# Patient Record
Sex: Female | Born: 1985 | Race: Black or African American | Hispanic: No | Marital: Single | Smoking: Never smoker
Health system: Southern US, Community
[De-identification: ages and names within clinical notes are randomized; demographics above are authoritative.]

## PROBLEM LIST (undated history)

## (undated) DIAGNOSIS — T7840XA Allergy, unspecified, initial encounter: Secondary | ICD-10-CM

## (undated) HISTORY — DX: Allergy, unspecified, initial encounter: T78.40XA

---

## 2011-05-08 ENCOUNTER — Emergency Department (HOSPITAL_BASED_OUTPATIENT_CLINIC_OR_DEPARTMENT_OTHER)
Admission: EM | Admit: 2011-05-08 | Discharge: 2011-05-08 | Disposition: A | Discharge status: Home / Self Care | Payer: BC Managed Care – PPO | Attending: Emergency Medicine | Admitting: Emergency Medicine

## 2011-05-08 DIAGNOSIS — L02419 Cutaneous abscess of limb, unspecified: Secondary | ICD-10-CM | POA: Insufficient documentation

## 2011-05-11 ENCOUNTER — Emergency Department (HOSPITAL_BASED_OUTPATIENT_CLINIC_OR_DEPARTMENT_OTHER)
Admission: EM | Admit: 2011-05-11 | Discharge: 2011-05-11 | Disposition: A | Discharge status: Home / Self Care | Payer: BC Managed Care – PPO | Attending: Emergency Medicine | Admitting: Emergency Medicine

## 2011-05-11 DIAGNOSIS — Z4800 Encounter for change or removal of nonsurgical wound dressing: Secondary | ICD-10-CM | POA: Insufficient documentation

## 2011-05-11 LAB — WOUND CULTURE

## 2011-10-06 ENCOUNTER — Emergency Department (HOSPITAL_COMMUNITY)
Admission: EM | Admit: 2011-10-06 | Discharge: 2011-10-07 | Disposition: A | Discharge status: Home / Self Care | Payer: Worker's Compensation | Attending: Emergency Medicine | Admitting: Emergency Medicine

## 2011-10-06 ENCOUNTER — Emergency Department (HOSPITAL_COMMUNITY): Payer: Worker's Compensation

## 2011-10-06 DIAGNOSIS — Y990 Civilian activity done for income or pay: Secondary | ICD-10-CM | POA: Insufficient documentation

## 2011-10-06 DIAGNOSIS — IMO0002 Reserved for concepts with insufficient information to code with codable children: Secondary | ICD-10-CM | POA: Insufficient documentation

## 2011-10-06 DIAGNOSIS — S0003XA Contusion of scalp, initial encounter: Secondary | ICD-10-CM | POA: Insufficient documentation

## 2011-10-06 DIAGNOSIS — S0083XA Contusion of other part of head, initial encounter: Secondary | ICD-10-CM | POA: Insufficient documentation

## 2011-10-06 DIAGNOSIS — Y9289 Other specified places as the place of occurrence of the external cause: Secondary | ICD-10-CM | POA: Insufficient documentation

## 2011-10-06 DIAGNOSIS — S0990XA Unspecified injury of head, initial encounter: Secondary | ICD-10-CM | POA: Insufficient documentation

## 2013-01-09 ENCOUNTER — Ambulatory Visit (INDEPENDENT_AMBULATORY_CARE_PROVIDER_SITE_OTHER): Payer: BC Managed Care – PPO | Admitting: Family Medicine

## 2013-01-09 VITALS — BP 127/79 | HR 79 | Temp 98.7°F | Resp 16 | Ht 65.0 in | Wt 188.4 lb

## 2013-01-09 DIAGNOSIS — R8281 Pyuria: Secondary | ICD-10-CM

## 2013-01-09 DIAGNOSIS — R1110 Vomiting, unspecified: Secondary | ICD-10-CM

## 2013-01-09 DIAGNOSIS — R1084 Generalized abdominal pain: Secondary | ICD-10-CM

## 2013-01-09 LAB — POCT CBC
HCT, POC: 41 % (ref 37.7–47.9)
Lymph, poc: 3 (ref 0.6–3.4)
MCHC: 30.2 g/dL — AB (ref 31.8–35.4)
MCV: 88.6 fL (ref 80–97)
POC Granulocyte: 4.3 (ref 2–6.9)
POC LYMPH PERCENT: 39.1 %L (ref 10–50)
RDW, POC: 14.7 %

## 2013-01-09 LAB — POCT URINALYSIS DIPSTICK
Leukocytes, UA: NEGATIVE
Nitrite, UA: NEGATIVE
Protein, UA: NEGATIVE
Urobilinogen, UA: 0.2
pH, UA: 5.5

## 2013-01-09 LAB — POCT UA - MICROSCOPIC ONLY
Crystals, Ur, HPF, POC: NEGATIVE
Yeast, UA: NEGATIVE

## 2013-01-09 MED ORDER — ONDANSETRON 4 MG PO TBDP: 4.0000 mg | ORAL_TABLET | Freq: Three times a day (TID) | ORAL | Status: DC | PRN

## 2013-01-09 MED ORDER — CIPROFLOXACIN HCL 500 MG PO TABS: 500.0000 mg | ORAL_TABLET | Freq: Two times a day (BID) | ORAL | Status: DC

## 2013-01-09 NOTE — Patient Instructions (Addendum)
You can start the antibiotic for a possible urinary infection, but we should know the culture results in a few days. You likely have a stomach virus that should be improving over the next few days. You can take zofran up to every 8 hours as needed for nausea.  Drink frequent sips of fluids, and start back with bland foods slowly if vomiting improves. Return to the clinic or go to the nearest emergency room if any of your symptoms worsen or new symptoms occur. Recheck in next 48 hours if not improving.  Nausea and Vomiting Nausea is a sick feeling that often comes before throwing up (vomiting). Vomiting is a reflex where stomach contents come out of your mouth. Vomiting can cause severe loss of body fluids (dehydration). Children and elderly adults can become dehydrated quickly, especially if they also have diarrhea. Nausea and vomiting are symptoms of a condition or disease. It is important to find the cause of your symptoms. CAUSES   Direct irritation of the stomach lining. This irritation can result from increased acid production (gastroesophageal reflux disease), infection, food poisoning, taking certain medicines (such as nonsteroidal anti-inflammatory drugs), alcohol use, or tobacco use.  Signals from the brain.These signals could be caused by a headache, heat exposure, an inner ear disturbance, increased pressure in the brain from injury, infection, a tumor, or a concussion, pain, emotional stimulus, or metabolic problems.  An obstruction in the gastrointestinal tract (bowel obstruction).  Illnesses such as diabetes, hepatitis, gallbladder problems, appendicitis, kidney problems, cancer, sepsis, atypical symptoms of a heart attack, or eating disorders.  Medical treatments such as chemotherapy and radiation.  Receiving medicine that makes you sleep (general anesthetic) during surgery. DIAGNOSIS Your caregiver may ask for tests to be done if the problems do not improve after a few days. Tests  may also be done if symptoms are severe or if the reason for the nausea and vomiting is not clear. Tests may include:  Urine tests.  Blood tests.  Stool tests.  Cultures (to look for evidence of infection).  X-rays or other imaging studies. Test results can help your caregiver make decisions about treatment or the need for additional tests. TREATMENT You need to stay well hydrated. Drink frequently but in small amounts.You may wish to drink water, sports drinks, clear broth, or eat frozen ice pops or gelatin dessert to help stay hydrated.When you eat, eating slowly may help prevent nausea.There are also some antinausea medicines that may help prevent nausea. HOME CARE INSTRUCTIONS   Take all medicine as directed by your caregiver.  If you do not have an appetite, do not force yourself to eat. However, you must continue to drink fluids.  If you have an appetite, eat a normal diet unless your caregiver tells you differently.  Eat a variety of complex carbohydrates (rice, wheat, potatoes, bread), lean meats, yogurt, fruits, and vegetables.  Avoid high-fat foods because they are more difficult to digest.  Drink enough water and fluids to keep your urine clear or pale yellow.  If you are dehydrated, ask your caregiver for specific rehydration instructions. Signs of dehydration may include:  Severe thirst.  Dry lips and mouth.  Dizziness.  Dark urine.  Decreasing urine frequency and amount.  Confusion.  Rapid breathing or pulse. SEEK IMMEDIATE MEDICAL CARE IF:   You have blood or brown flecks (like coffee grounds) in your vomit.  You have black or bloody stools.  You have a severe headache or stiff neck.  You are confused.  You  have severe abdominal pain.  You have chest pain or trouble breathing.  You do not urinate at least once every 8 hours.  You develop cold or clammy skin.  You continue to vomit for longer than 24 to 48 hours.  You have a fever. MAKE  SURE YOU:   Understand these instructions.  Will watch your condition.  Will get help right away if you are not doing well or get worse. Document Released: 12/12/2005 Document Revised: 03/05/2012 Document Reviewed: 05/11/2011 [facility] Patient Information 2013 [place], [place].

## 2013-01-09 NOTE — Progress Notes (Signed)
Subjective:    Patient ID: Alice Henry, female    DOB: [birth year], 27 y.o.   MRN: [id number]  HPI Alice Henry is a 27 y.o. female  Started 2 days ago at work - nausea out of the blue, and then vomiting. 2-3 episodes per day for past 2 days, but less today - just dry heaving.  No fever. No diarrhea.  Slight discomfort in abdomen with vomiting  And dry heaving. Ate boiled egg this am - vomited.  Able to drink water and chicken broth.   Tx: ibuprofen. pepto bismol - 1-2 times. No relief.   Had coworker with "flu symptoms". Works with alzheimers patients - activities, etc. Rare alcohol - 2 glasses on weekends. No recent increases.   LNMP - currently - started 5 days ago, normal.  Prior NMP - 2nd weekend in December.   No hx of surgeries.    Review of Systems  Constitutional: Negative for fever and chills.  Respiratory: Negative for cough and shortness of breath.   Cardiovascular: Negative for chest pain.  Gastrointestinal: Positive for nausea and vomiting.  Genitourinary: Negative for dysuria, urgency, hematuria and difficulty urinating.  Skin: Negative for rash.  Neurological: Positive for light-headedness (minimal last 2 days, but not today. keeping fluids down today. ).       Objective:   Physical Exam  Vitals reviewed. Constitutional: She is oriented to person, place, and time. She appears well-developed and well-nourished.  HENT:  Mouth/Throat: Oropharynx is clear and moist. No oropharyngeal exudate.  Eyes: EOM are normal. Pupils are equal, round, and reactive to light.  Cardiovascular: Normal rate, regular rhythm, normal heart sounds and intact distal pulses.   Pulmonary/Chest: Effort normal.  Abdominal: Soft. Bowel sounds are normal. There is generalized tenderness (most ttp - epigastric and suprapubic. ). There is no rebound, no CVA tenderness and negative Murphy's sign.  Lymphadenopathy:    She has no cervical adenopathy.  Neurological: She is alert and oriented to  person, place, and time.  Skin: Skin is warm and dry.  Psychiatric: She has a normal mood and affect. Her behavior is normal.    Results for orders placed in visit on 01/09/13  POCT CBC      Component Value Range   WBC 7.7  4.6 - 10.2 K/uL   Lymph, poc 3.0  0.6 - 3.4   POC LYMPH PERCENT 39.1  10 - 50 %L   MID (cbc) 0.4  0 - 0.9   POC MID % 5.6  0 - 12 %M   POC Granulocyte 4.3  2 - 6.9   Granulocyte percent 55.3  37 - 80 %G   RBC 4.63  4.04 - 5.48 M/uL   Hemoglobin 12.4  12.2 - 16.2 g/dL   HCT, POC 56.2  13.0 - 47.9 %   MCV 88.6  80 - 97 fL   MCH, POC 26.8 (*) 27 - 31.2 pg   MCHC 30.2 (*) 31.8 - 35.4 g/dL   RDW, POC 86.5     Platelet Count, POC 431 (*) 142 - 424 K/uL   MPV 8.5  0 - 99.8 fL  POCT UA - MICROSCOPIC ONLY      Component Value Range   WBC, Ur, HPF, POC 4-10     RBC, urine, microscopic 0-1     Bacteria, U Microscopic trace     Mucus, UA neg     Epithelial cells, urine per micros 1-6     Crystals, Ur, HPF, POC neg  Casts, Ur, LPF, POC neg     Yeast, UA neg    POCT URINALYSIS DIPSTICK      Component Value Range   Color, UA yellow     Clarity, UA clear     Glucose, UA neg     Bilirubin, UA neg     Ketones, UA trace     Spec Grav, UA 1.020     Blood, UA large     pH, UA 5.5     Protein, UA neg     Urobilinogen, UA 0.2     Nitrite, UA neg     Leukocytes, UA Negative    POCT URINE PREGNANCY      Component Value Range   Preg Test, Ur Negative           Assessment & Plan:  Alice Henry is a 27 y.o. female 1. Abdominal pain, generalized  POCT CBC, POCT UA - Microscopic Only, POCT urinalysis dipstick, POCT urine pregnancy, Urine culture, ciprofloxacin (CIPRO) 500 MG tablet  2. Vomiting  POCT CBC, POCT UA - Microscopic Only, POCT urinalysis dipstick, POCT urine pregnancy, ondansetron (ZOFRAN ODT) 4 MG disintegrating tablet  3. Pyuria  Urine culture, ciprofloxacin (CIPRO) 500 MG tablet   Suspected viral enteritis with cramping pain from vomiting.  Appears  well hydrated overall, and less vomiting. Tolerating po fluids. Reassuring CBC.  zofran if needed. Recheck in 48hrs if not improving - sooner if worse. Understanding expressed.   Pyuria with abd pain and vomiting, but no fever/CVAT. Will cover with Cipro for now, but stop if cx negative. rtc precautions.  Patient Instructions  You can start the antibiotic for a possible urinary infection, but we should know the culture results in a few days. You likely have a stomach virus that should be improving over the next few days. You can take zofran up to every 8 hours as needed for nausea.  Drink frequent sips of fluids, and start back with bland foods slowly if vomiting improves. Return to the clinic or go to the nearest emergency room if any of your symptoms worsen or new symptoms occur. Recheck in next 48 hours if not improving.  Nausea and Vomiting Nausea is a sick feeling that often comes before throwing up (vomiting). Vomiting is a reflex where stomach contents come out of your mouth. Vomiting can cause severe loss of body fluids (dehydration). Children and elderly adults can become dehydrated quickly, especially if they also have diarrhea. Nausea and vomiting are symptoms of a condition or disease. It is important to find the cause of your symptoms. CAUSES   Direct irritation of the stomach lining. This irritation can result from increased acid production (gastroesophageal reflux disease), infection, food poisoning, taking certain medicines (such as nonsteroidal anti-inflammatory drugs), alcohol use, or tobacco use.  Signals from the brain.These signals could be caused by a headache, heat exposure, an inner ear disturbance, increased pressure in the brain from injury, infection, a tumor, or a concussion, pain, emotional stimulus, or metabolic problems.  An obstruction in the gastrointestinal tract (bowel obstruction).  Illnesses such as diabetes, hepatitis, gallbladder problems, appendicitis, kidney  problems, cancer, sepsis, atypical symptoms of a heart attack, or eating disorders.  Medical treatments such as chemotherapy and radiation.  Receiving medicine that makes you sleep (general anesthetic) during surgery. DIAGNOSIS Your caregiver may ask for tests to be done if the problems do not improve after a few days. Tests may also be done if symptoms are severe or if the reason  for the nausea and vomiting is not clear. Tests may include:  Urine tests.  Blood tests.  Stool tests.  Cultures (to look for evidence of infection).  X-rays or other imaging studies. Test results can help your caregiver make decisions about treatment or the need for additional tests. TREATMENT You need to stay well hydrated. Drink frequently but in small amounts.You may wish to drink water, sports drinks, clear broth, or eat frozen ice pops or gelatin dessert to help stay hydrated.When you eat, eating slowly may help prevent nausea.There are also some antinausea medicines that may help prevent nausea. HOME CARE INSTRUCTIONS   Take all medicine as directed by your caregiver.  If you do not have an appetite, do not force yourself to eat. However, you must continue to drink fluids.  If you have an appetite, eat a normal diet unless your caregiver tells you differently.  Eat a variety of complex carbohydrates (rice, wheat, potatoes, bread), lean meats, yogurt, fruits, and vegetables.  Avoid high-fat foods because they are more difficult to digest.  Drink enough water and fluids to keep your urine clear or pale yellow.  If you are dehydrated, ask your caregiver for specific rehydration instructions. Signs of dehydration may include:  Severe thirst.  Dry lips and mouth.  Dizziness.  Dark urine.  Decreasing urine frequency and amount.  Confusion.  Rapid breathing or pulse. SEEK IMMEDIATE MEDICAL CARE IF:   You have blood or brown flecks (like coffee grounds) in your vomit.  You have black  or bloody stools.  You have a severe headache or stiff neck.  You are confused.  You have severe abdominal pain.  You have chest pain or trouble breathing.  You do not urinate at least once every 8 hours.  You develop cold or clammy skin.  You continue to vomit for longer than 24 to 48 hours.  You have a fever. MAKE SURE YOU:   Understand these instructions.  Will watch your condition.  Will get help right away if you are not doing well or get worse. Document Released: 12/12/2005 Document Revised: 03/05/2012 Document Reviewed: 05/11/2011 [facility] Patient Information 2013 [place], [place].

## 2013-02-20 ENCOUNTER — Emergency Department (HOSPITAL_COMMUNITY)
Admission: EM | Admit: 2013-02-20 | Discharge: 2013-02-20 | Disposition: A | Discharge status: Home / Self Care | Payer: BC Managed Care – PPO | Attending: Emergency Medicine | Admitting: Emergency Medicine

## 2013-02-20 ENCOUNTER — Emergency Department (HOSPITAL_COMMUNITY): Payer: BC Managed Care – PPO

## 2013-02-20 ENCOUNTER — Encounter (HOSPITAL_COMMUNITY): Payer: Self-pay | Admitting: Emergency Medicine

## 2013-02-20 DIAGNOSIS — Y92410 Unspecified street and highway as the place of occurrence of the external cause: Secondary | ICD-10-CM | POA: Insufficient documentation

## 2013-02-20 DIAGNOSIS — Y9389 Activity, other specified: Secondary | ICD-10-CM | POA: Insufficient documentation

## 2013-02-20 DIAGNOSIS — S161XXA Strain of muscle, fascia and tendon at neck level, initial encounter: Secondary | ICD-10-CM

## 2013-02-20 DIAGNOSIS — S139XXA Sprain of joints and ligaments of unspecified parts of neck, initial encounter: Secondary | ICD-10-CM | POA: Insufficient documentation

## 2013-02-20 MED ORDER — HYDROCODONE-ACETAMINOPHEN 5-325 MG PO TABS: 1.0000 | ORAL_TABLET | Freq: Four times a day (QID) | ORAL | Status: DC | PRN

## 2013-02-20 MED ORDER — ACETAMINOPHEN 325 MG PO TABS
650.0000 mg | ORAL_TABLET | Freq: Once | ORAL | Status: AC
  Administered 2013-02-20: 650 mg via ORAL
  Filled 2013-02-20: qty 2

## 2013-02-20 MED ORDER — CYCLOBENZAPRINE HCL 5 MG PO TABS: 5.0000 mg | ORAL_TABLET | Freq: Two times a day (BID) | ORAL | Status: DC | PRN
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
## 2013-02-20 NOTE — ED Provider Notes (Addendum)
I  evaluated the patient, reviewed the resident's note and I agree with the findings and plan.   .Face to face Exam:  General:  Awake Resp:  Normal effort Abd:  Nondistended Neuro:No focal weakness Lymph: No adenopathy     Nelia Shi, MD 02/20/13 1948  Nelia Shi, MD 03/15/13 1029
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
## 2013-02-20 NOTE — Progress Notes (Signed)
Pt confirmed Dr Ivory Broad in [place] as pcp EPIC updated
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
## 2014-01-02 ENCOUNTER — Encounter (HOSPITAL_COMMUNITY): Payer: Self-pay | Admitting: Emergency Medicine

## 2014-01-02 ENCOUNTER — Emergency Department (HOSPITAL_COMMUNITY)
Admission: EM | Admit: 2014-01-02 | Discharge: 2014-01-02 | Disposition: A | Discharge status: Home / Self Care | Payer: 59 | Attending: Emergency Medicine | Admitting: Emergency Medicine

## 2014-01-02 DIAGNOSIS — H16009 Unspecified corneal ulcer, unspecified eye: Secondary | ICD-10-CM | POA: Insufficient documentation

## 2014-01-02 DIAGNOSIS — H16001 Unspecified corneal ulcer, right eye: Secondary | ICD-10-CM

## 2014-01-02 MED ORDER — CEPHALEXIN 500 MG PO CAPS: 500.0000 mg | ORAL_CAPSULE | Freq: Four times a day (QID) | ORAL | Status: DC

## 2014-01-02 MED ORDER — CEPHALEXIN 500 MG PO CAPS
500.0000 mg | ORAL_CAPSULE | Freq: Once | ORAL | Status: AC
  Administered 2014-01-02: 500 mg via ORAL
  Filled 2014-01-02: qty 1

## 2014-01-02 MED ORDER — OFLOXACIN 0.3 % OP SOLN
2.0000 [drp] | Freq: Once | OPHTHALMIC | Status: AC
  Administered 2014-01-02: 2 [drp] via OPHTHALMIC

## 2014-01-02 MED ORDER — OFLOXACIN 0.3 % OP SOLN
2.0000 [drp] | Freq: Four times a day (QID) | OPHTHALMIC | Status: DC
Start: 2014-01-02 — End: 2014-01-02
  Filled 2014-01-02: qty 5

## 2014-01-02 MED ORDER — FLUORESCEIN SODIUM 1 MG OP STRP
1.0000 | ORAL_STRIP | Freq: Once | OPHTHALMIC | Status: AC
  Administered 2014-01-02: 1 via OPHTHALMIC
  Filled 2014-01-02: qty 1

## 2014-01-02 MED ORDER — OXYCODONE-ACETAMINOPHEN 5-325 MG PO TABS: 1.0000 | ORAL_TABLET | ORAL | Status: DC | PRN

## 2014-01-02 MED ORDER — OFLOXACIN 0.3 % OP SOLN: 1.0000 [drp] | OPHTHALMIC | Status: DC

## 2014-01-02 MED ORDER — IBUPROFEN 200 MG PO TABS
600.0000 mg | ORAL_TABLET | Freq: Once | ORAL | Status: AC
  Administered 2014-01-02: 600 mg via ORAL
  Filled 2014-01-02: qty 3

## 2014-01-02 NOTE — Discharge Instructions (Signed)
Ocuflox (antibiotic eye drops) 1-2 drops to R eye every hour today. Every two hours tomorrow and then every 3 hours for 1 week. Take keflex (antibiotics pill) as directed until finished. Pain medicine as needed. Follow-up with opthalmology as soon as possible. Do not wear contacts until told you can by ophthalmologist.   Corneal Ulcer A corneal ulcer is an open sore on the cornea. The cornea is the clear covering at the front and center of the eye.  CAUSES  Most corneal ulcers are caused by infection, but there are other causes as well.  Bacterial infection. A bacterial infection can occur and cause a corneal ulcer if:  Contact lenses are worn too long (especially overnight) or are not properly cared for.  An eye injury occurs, allowing bacteria to infect the area of injury.  Viral infection. A viral infection can occur and cause a corneal ulcer if:  The eye becomes infected with a virus, such as the herpes simplex (cold sore) virus, chickenpox virus, or shingles virus.  Fungal infection. A fungal infection can occur and cause a corneal ulcer if:  An eye injury resulted from contact with a plant or plant material.  An anti-inflammatory eye drop is overused.  You have a weakened immune system.  Contact lenses are improperly cared for or become infected.  Foreign bodies in the eye, such as sand, glass, or small pieces of glass or metal.  Dry eyes.  Certain disorders that prevent eyelids from closing completely, such as Bell's palsy.  Contact lenses, especially extended-wear soft contact lenses. Contact lenses can:  Scratch the cornea's surface, allowing bacteria to enter the scratch.  Trap dirt underneath the contact lens, which can scratch the cornea.  Harbor bacteria and fungi, making it more likely for bacterial infections to occur.  Block oxygen from the cornea, making it more likely for infections to occur. SYMPTOMS   Eye pain that is often severe.  Blurry  vision.  Light sensitivity.  Pus or thick discharge coming from your eye.  Eye redness.  Feeling like something is in your eye.  Watery or itchy eye.  Burning or stinging feeling. Some ulcers that are very big may be seen as a white spot on the cornea. DIAGNOSIS  An eye exam will be performed. Your health care provider may use a special kind of microscope (slit lamp) to look at the cornea. Eye drops may be put into the eye to make the ulcer easier to see. If it is suspected that an infection caused the corneal ulcer, tissue samples or cultures from the eye may be taken. Numbing eye drops will be given before any samples or cultures are taken. The samples or cultures will be examined in the lab to check for bacteria, viruses, or fungi. TREATMENT  Treatment of the corneal ulcer depends on the cause. If your ulcer is severe, you may be given antibiotic eye drops up until your health care provider knows the test results. Other treatments can include:  Antibacterial, antiviral, or antifungal eye drops or ointment.  Removing the foreign body that caused the eye injury.  Artificial tears or a bandage contact lens if severe dry eyes caused the corneal ulcer.  Over-the-counter or prescription pain medicine.  Steroidal eye drops if the eye is inflamed and swollen.  Antibiotic medicines by mouth.  An injection of medicine under the thin membrane covering the eyeball (conjunctiva). This allows medicine to reach the ulcer in high doses.  Eye patching to reduce irritation from blinking  and bright light. An eye patch may not be given if the ulcer was caused by a bacterial infection. If the corneal ulcer causes a scar on the cornea that interferes with vision, hospitalization and surgery may be needed to replace the cornea (corneal transplant). HOME CARE INSTRUCTIONS   If prescribed, use your antibiotic pills, eye drops, or ointment as directed. Continue using them even if you start to feel  better. You may have to apply eye drops as often as every few minutes to every hour, for days. It may be necessary to set your alarm clock every few minutes to every hour during the night. This is absolutely necessary.  Only take over-the-counter or prescription medicines as directed by your health care provider.  Apply artificial tears as needed if you have dry eyes.  Do not touch or rub your eye, because this may increase the irritation and spread the infection.  Avoid wearing eye makeup.  Stay in a dark room and use sunglasses if you have light sensitivity.  Apply cool packs to your eye to relieve discomfort and swelling.  If your eye is patched, you should not drive or use machinery. You will have reduced side vision and ability to judge distance.  Do not drive or operate machinery until approved by your health care provider. Your ability to judge distances is impaired.  Follow up with your health care provider as directed.  Do not wear contact lenses until your health care provider approves. If you normally wear contact lenses, follow these general rules to avoid the risk of a corneal ulcer:  Do not wear contact lenses while you sleep.  Wash your hands before removing contact lenses.  Properly sterilize and store your contact lenses.  Regularly clean your contact lens case.  Do not use your saliva or tap water to clean or wet your contact lenses.  Remove your contact lenses if your eye becomes irritated. You may put them back in once your eyes feel better. SEEK IMMEDIATE MEDICAL CARE IF:   You notice a change in your vision.  Your pain is getting worse, not better.  You have increasing discharge from the eye. MAKE SURE YOU:   Understand these instructions.  Will watch your condition.  Will get help right away if you are not doing well or get worse. Document Released: 01/19/2005 Document Revised: 08/14/2013 Document Reviewed: 05/14/2013 [facility] Patient  Information 2014 [employer] [place].

## 2014-01-02 NOTE — ED Notes (Addendum)
Pt reports wearing contacts. Pt states fell asleep in contacts last night. Awake with right side eye swelling. Pt reports contacts removed this morning when noticed swelling. Pt currently has glasses on. Pt denies event like this in past. Pt reports normal vision in left eye.

## 2014-01-02 NOTE — ED Notes (Signed)
Patient states she woke this AM with right swelling and a burning pain. No drainage present.

## 2014-01-02 NOTE — ED Provider Notes (Signed)
CSN: [id number]     Arrival date & time 01/02/14  0907 History   First MD Initiated Contact with Patient 01/02/14 0915     Chief Complaint  Patient presents with  . Eye Pain    swelling   (Consider location/radiation/quality/duration/timing/severity/associated sxs/prior Treatment) HPI  28 year old female with right eye pain. Onset this morning. She woke up with pain. Patient does wear contact lenses. She slept in her contacts last night. Pain was worse after removed contact this morning.. Constant. Burning in quality. Photophobia. No change in visual acuity. Tearing. Swelling of her upper eyelid. No fevers or chills. No history similar symptoms. No HA.   Past Medical History  Diagnosis Date  . Allergy    History reviewed. No pertinent past surgical history. Family History  Problem Relation Age of Onset  . Hypertension Mother    History  Substance Use Topics  . Smoking status: Never Smoker   . Smokeless tobacco: Never Used  . Alcohol Use: Yes     Comment: seldom   OB History   Grav Para Term Preterm Abortions TAB SAB Ect Mult Living                 Review of Systems  All systems reviewed and negative, other than as noted in HPI.   Allergies  Review of patient's allergies indicates no known allergies.  Home Medications   Current Outpatient Rx  Name  Route  Sig  Dispense  Refill  . ibuprofen (ADVIL,MOTRIN) 200 MG tablet   Oral   Take 200 mg by mouth every 8 (eight) hours as needed for pain.           BP 123/70  Pulse 73  Temp(Src) 98.2 F (36.8 C) (Oral)  Resp 16  Ht 5\' 6"  (1.676 m)  Wt 193 lb (87.544 kg)  BMI 31.17 kg/m2  SpO2 98%  LMP 01/01/2014 Physical Exam  Nursing note and vitals reviewed. Constitutional: She appears well-developed and well-nourished. No distress.  HENT:  Head: Normocephalic and atraumatic.  Eyes: Right eye exhibits no discharge. Left eye exhibits no discharge.    R upper lid edema. Mild TTP. Periorbital tissue, lids and lases  otherwise normal. No proptosis.Scleral injection R eye. Some tearing. Superficial ulceration noted on slit lamp exam. Pain improved with tetracaine. EOMI. Denies increase in pain with eye movement. PERRL.   Neck: Neck supple.  Cardiovascular: Normal rate, regular rhythm and normal heart sounds.  Exam reveals no gallop and no friction rub.   No murmur heard. Pulmonary/Chest: Effort normal and breath sounds normal. No respiratory distress.  Abdominal: Soft. She exhibits no distension. There is no tenderness.  Musculoskeletal: She exhibits no edema and no tenderness.  Neurological: She is alert.  Skin: Skin is warm and dry.  Psychiatric: She has a normal mood and affect. Her behavior is normal. Thought content normal.    ED Course  Procedures (including critical care time) Labs Review Labs Reviewed - No data to display Imaging Review No results found.  EKG Interpretation   None       MDM   1. Corneal ulcer, right      27yF with R eye pain. Corneal ulcer on exam, likely from prolonged contact lens wearing. Suspect upper lid edema reactive. Cannot r/o preseptal cellulitis. Will tx with keflex. No proptosis, significant pain with eye movement or findings to suggest orbital involvement. Given ocuflox in ED and sent with bottle. Instructed to use every hour today, every two hours tomorrow and then  every 3 hours after this for 1 week. Close optho follow-up. No contact lens use until evaluated and cleared by optho. Stressed how serious corneal ulcers can potentially be a importance of compliance with medications and making an appointment with opthalmology as soon as possible.     Raeford Razor, MD 01/06/14 [phone]

## 2017-03-15 ENCOUNTER — Emergency Department (HOSPITAL_BASED_OUTPATIENT_CLINIC_OR_DEPARTMENT_OTHER)
Admission: EM | Admit: 2017-03-15 | Discharge: 2017-03-15 | Disposition: A | Discharge status: Home / Self Care | Payer: BLUE CROSS/BLUE SHIELD | Attending: Emergency Medicine | Admitting: Emergency Medicine

## 2017-03-15 ENCOUNTER — Encounter (HOSPITAL_BASED_OUTPATIENT_CLINIC_OR_DEPARTMENT_OTHER): Payer: Self-pay | Admitting: *Deleted

## 2017-03-15 ENCOUNTER — Emergency Department (HOSPITAL_BASED_OUTPATIENT_CLINIC_OR_DEPARTMENT_OTHER): Payer: BLUE CROSS/BLUE SHIELD

## 2017-03-15 DIAGNOSIS — N83209 Unspecified ovarian cyst, unspecified side: Secondary | ICD-10-CM

## 2017-03-15 DIAGNOSIS — R1031 Right lower quadrant pain: Secondary | ICD-10-CM | POA: Diagnosis present

## 2017-03-15 DIAGNOSIS — N83201 Unspecified ovarian cyst, right side: Secondary | ICD-10-CM | POA: Diagnosis not present

## 2017-03-15 LAB — COMPREHENSIVE METABOLIC PANEL
ALT: 19 U/L (ref 14–54)
ANION GAP: 6 (ref 5–15)
AST: 20 U/L (ref 15–41)
Albumin: 3.9 g/dL (ref 3.5–5.0)
Alkaline Phosphatase: 78 U/L (ref 38–126)
BUN: 11 mg/dL (ref 6–20)
CHLORIDE: 106 mmol/L (ref 101–111)
CO2: 25 mmol/L (ref 22–32)
CREATININE: 0.77 mg/dL (ref 0.44–1.00)
Calcium: 9.1 mg/dL (ref 8.9–10.3)
Glucose, Bld: 95 mg/dL (ref 65–99)
POTASSIUM: 4.5 mmol/L (ref 3.5–5.1)
SODIUM: 137 mmol/L (ref 135–145)
Total Bilirubin: 0.6 mg/dL (ref 0.3–1.2)
Total Protein: 7.5 g/dL (ref 6.5–8.1)

## 2017-03-15 LAB — URINALYSIS, ROUTINE W REFLEX MICROSCOPIC
BILIRUBIN URINE: NEGATIVE
Glucose, UA: NEGATIVE mg/dL
Hgb urine dipstick: NEGATIVE
Ketones, ur: NEGATIVE mg/dL
NITRITE: NEGATIVE
PROTEIN: NEGATIVE mg/dL
SPECIFIC GRAVITY, URINE: 1.013 (ref 1.005–1.030)
pH: 7.5 (ref 5.0–8.0)

## 2017-03-15 LAB — CBC WITH DIFFERENTIAL/PLATELET
Basophils Absolute: 0 10*3/uL (ref 0.0–0.1)
Basophils Relative: 0 %
EOS PCT: 2 %
Eosinophils Absolute: 0.1 10*3/uL (ref 0.0–0.7)
HCT: 38.4 % (ref 36.0–46.0)
HEMOGLOBIN: 13.2 g/dL (ref 12.0–15.0)
LYMPHS ABS: 0.7 10*3/uL (ref 0.7–4.0)
LYMPHS PCT: 10 %
MCH: 29.1 pg (ref 26.0–34.0)
MCHC: 34.4 g/dL (ref 30.0–36.0)
MCV: 84.6 fL (ref 78.0–100.0)
Monocytes Absolute: 0.6 10*3/uL (ref 0.1–1.0)
Monocytes Relative: 9 %
NEUTROS ABS: 5.2 10*3/uL (ref 1.7–7.7)
NEUTROS PCT: 79 %
Platelets: 307 10*3/uL (ref 150–400)
RBC: 4.54 MIL/uL (ref 3.87–5.11)
RDW: 13.7 % (ref 11.5–15.5)
WBC: 6.6 10*3/uL (ref 4.0–10.5)

## 2017-03-15 LAB — URINALYSIS, MICROSCOPIC (REFLEX)

## 2017-03-15 LAB — LIPASE, BLOOD: LIPASE: 30 U/L (ref 11–51)

## 2017-03-15 LAB — PREGNANCY, URINE: Preg Test, Ur: NEGATIVE

## 2017-03-15 MED ORDER — SODIUM CHLORIDE 0.9 % IV BOLUS (SEPSIS)
1000.0000 mL | Freq: Once | INTRAVENOUS | Status: AC
  Administered 2017-03-15: 1000 mL via INTRAVENOUS

## 2017-03-15 MED ORDER — HYDROCODONE-ACETAMINOPHEN 5-325 MG PO TABS: 2.0000 | ORAL_TABLET | ORAL | 0 refills | Status: DC | PRN

## 2017-03-15 MED ORDER — ONDANSETRON 8 MG PO TBDP: ORAL_TABLET | ORAL | 0 refills | Status: DC

## 2017-03-15 MED ORDER — ONDANSETRON HCL 4 MG/2ML IJ SOLN
4.0000 mg | Freq: Once | INTRAMUSCULAR | Status: AC
  Administered 2017-03-15: 4 mg via INTRAVENOUS
  Filled 2017-03-15: qty 2

## 2017-03-15 MED FILL — ONDANSETRON ODT 8 MG TABLET: 8 | 2 days supply | Qty: 6 | Fill #0

## 2017-03-15 MED FILL — HYDROCODON-APAP 5-325: 5-325 | 2 days supply | Qty: 15 | Fill #0

## 2017-03-15 NOTE — Discharge Instructions (Signed)
Hydrocodone as prescribed as needed for pain. Zofran as prescribed as needed for nausea  You are to follow-up with your primary Dr. in the next 2 weeks, and radiology is recommending a repeat ultrasound in 6-12 weeks to ensure resolution of the cyst.  Return to the emergency department in the meantime if you develop worsening pain, high fevers, bloody stools, significant vaginal bleeding, or other new and concerning symptoms.

## 2017-03-15 NOTE — ED Provider Notes (Signed)
[facility] Provider [facility]   CSN: [id number] Arrival date & time: 03/15/17  1007     History   Chief Complaint Chief Complaint  Patient presents with  . Abdominal Pain    HPI Alice Henry is a 31 y.o. female.  Patient is a 31 year old female with no significant past history. She presents for evaluation of right-sided abdominal pain. This started approximately 4 in the morning and has been vomiting since. She denies any fevers or chills. She denies any constipation or bloody stool. Her last menstrual period was weeks ago and normal. She highly doubts the possibility of pregnancy. She denies any vaginal bleeding or discharge.   The history is provided by the patient.  Abdominal Pain   This is a new problem. Episode onset: 4 AM. The problem occurs constantly. The problem has not changed since onset.The pain is associated with eating. The pain is located in the RLQ. The quality of the pain is sharp. The pain is moderate. Pertinent negatives include fever, melena, constipation and dysuria. Exacerbated by: Movement and palpation. Nothing relieves the symptoms.    Past Medical History:  Diagnosis Date  . Allergy     There are no active problems to display for this patient.   History reviewed. No pertinent surgical history.  OB History    No data available       Home Medications    Prior to Admission medications   Medication Sig Start Date End Date Taking? Authorizing Provider  ibuprofen (ADVIL,MOTRIN) 200 MG tablet Take 200 mg by mouth every 8 (eight) hours as needed for pain.     Historical Provider, MD    Family History Family History  Problem Relation Age of Onset  . Hypertension Mother     Social History Social History  Substance Use Topics  . Smoking status: Never Smoker  . Smokeless tobacco: Never Used  . Alcohol use Yes     Comment: seldom     Allergies   Patient has no known allergies.   Review of Systems Review of Systems    Constitutional: Negative for fever.  Gastrointestinal: Positive for abdominal pain. Negative for constipation and melena.  Genitourinary: Negative for dysuria.  All other systems reviewed and are negative.    Physical Exam Updated Vital Signs BP 139/90 (BP Location: Left Arm)   Pulse 96   Temp 99.1 F (37.3 C) (Oral)   Ht 5\' 5"  (1.651 m)   Wt 197 lb (89.4 kg)   LMP 02/23/2017   SpO2 100%   BMI 32.78 kg/m   Physical Exam  Constitutional: She is oriented to person, place, and time. She appears well-developed and well-nourished. No distress.  HENT:  Head: Normocephalic and atraumatic.  Neck: Normal range of motion. Neck supple.  Cardiovascular: Normal rate and regular rhythm.  Exam reveals no gallop and no friction rub.   No murmur heard. Pulmonary/Chest: Effort normal and breath sounds normal. No respiratory distress. She has no wheezes.  Abdominal: Soft. Bowel sounds are normal. She exhibits no distension. There is tenderness. There is no guarding. No hernia.  There is tenderness to palpation in the right lower quadrant.  Musculoskeletal: Normal range of motion.  Neurological: She is alert and oriented to person, place, and time.  Skin: Skin is warm and dry. She is not diaphoretic.  Nursing note and vitals reviewed.    ED Treatments / Results  Labs (all labs ordered are listed, but only abnormal results are displayed) Labs Reviewed  COMPREHENSIVE METABOLIC  PANEL  LIPASE, BLOOD  CBC WITH DIFFERENTIAL/PLATELET  PREGNANCY, URINE  URINALYSIS, ROUTINE W REFLEX MICROSCOPIC    EKG  EKG Interpretation None       Radiology No results found.  Procedures Procedures (including critical care time)  Medications Ordered in ED Medications  ondansetron (ZOFRAN) injection 4 mg (not administered)  sodium chloride 0.9 % bolus 1,000 mL (not administered)     Initial Impression / Assessment and Plan / ED Course  I have reviewed the triage vital signs and the nursing  notes.  Pertinent labs & imaging results that were available during my care of the patient were reviewed by me and considered in my medical decision making (see chart for details).  Patient with right lower quadrant pain that started in the night. Her pregnancy test is negative, thus ruling out an ectopic. Urinalysis is clear and symptoms are atypical for a renal calculus. The onset of discomfort and lack of white count is inconsistent with appendicitis. Ultrasound shows no evidence for torsion, however does show what appears to be a hemorrhagic cyst on the right ovary. She will be given pain medication and is to follow-up with her primary Dr. for a repeat ultrasound in 6-12 weeks to ensure resolution.  Final Clinical Impressions(s) / ED Diagnoses   Final diagnoses:  None    New Prescriptions New Prescriptions   No medications on file     Geoffery Lyons, MD 03/15/17 1304

## 2017-03-15 NOTE — ED Notes (Signed)
Patient transported to Ultrasound 

## 2017-03-15 NOTE — ED Triage Notes (Signed)
Pt reports sudden onset of right upper abd pain with n/v at 4am. Denies any diarrhea or fevers.

## 2019-06-12 ENCOUNTER — Other Ambulatory Visit: Payer: Self-pay

## 2019-06-12 ENCOUNTER — Encounter (HOSPITAL_BASED_OUTPATIENT_CLINIC_OR_DEPARTMENT_OTHER): Payer: Self-pay

## 2019-06-12 ENCOUNTER — Emergency Department (HOSPITAL_BASED_OUTPATIENT_CLINIC_OR_DEPARTMENT_OTHER): Payer: BC Managed Care – PPO

## 2019-06-12 ENCOUNTER — Inpatient Hospital Stay (HOSPITAL_BASED_OUTPATIENT_CLINIC_OR_DEPARTMENT_OTHER)
Admission: EM | Admit: 2019-06-12 | Discharge: 2019-06-16 | DRG: 176 | Disposition: A | Discharge status: Home / Self Care | Payer: BC Managed Care – PPO | Attending: Internal Medicine | Admitting: Internal Medicine

## 2019-06-12 DIAGNOSIS — M25512 Pain in left shoulder: Secondary | ICD-10-CM | POA: Diagnosis present

## 2019-06-12 DIAGNOSIS — Z8249 Family history of ischemic heart disease and other diseases of the circulatory system: Secondary | ICD-10-CM

## 2019-06-12 DIAGNOSIS — N760 Acute vaginitis: Secondary | ICD-10-CM | POA: Diagnosis present

## 2019-06-12 DIAGNOSIS — Z713 Dietary counseling and surveillance: Secondary | ICD-10-CM

## 2019-06-12 DIAGNOSIS — Z6838 Body mass index (BMI) 38.0-38.9, adult: Secondary | ICD-10-CM

## 2019-06-12 DIAGNOSIS — N390 Urinary tract infection, site not specified: Secondary | ICD-10-CM | POA: Diagnosis present

## 2019-06-12 DIAGNOSIS — I2699 Other pulmonary embolism without acute cor pulmonale: Secondary | ICD-10-CM | POA: Diagnosis not present

## 2019-06-12 DIAGNOSIS — Z20828 Contact with and (suspected) exposure to other viral communicable diseases: Secondary | ICD-10-CM | POA: Diagnosis present

## 2019-06-12 DIAGNOSIS — Z79899 Other long term (current) drug therapy: Secondary | ICD-10-CM

## 2019-06-12 MED ORDER — KETOROLAC TROMETHAMINE 30 MG/ML IJ SOLN
30.0000 mg | Freq: Once | INTRAMUSCULAR | Status: AC
  Administered 2019-06-13: 30 mg via INTRAVENOUS
  Filled 2019-06-12: qty 1

## 2019-06-12 NOTE — ED Triage Notes (Signed)
Left neck & shoulder pain, causing chest tightness. Reported difficulty taking a deep breath.

## 2019-06-12 NOTE — ED Provider Notes (Signed)
TIME SEEN: 11:18 PM  CHIEF COMPLAINT: Left shoulder pain, chest pain, upper abdominal pain  HPI: Patient is a 33 year old female with history of obesity who presents to the emergency department with several days of left shoulder pain that is painful with movement, palpation.  States pain is now in her chest and caused her to feel short of breath and feels like her chest is tight.  She also is now having upper abdominal pain.  No nausea, vomiting or diarrhea.  No fevers or chills.  No cough.  He is right-hand dominant.  No injury that she can remember.  Was seen at urgent care and started on cyclobenzaprine and has been taking anti-inflammatories over-the-counter and has not had any relief.  She became more concerned when she started having chest pain, shortness of breath and abdominal pain.  No history of PE, DVT, exogenous estrogen use, recent fractures, surgery, trauma, hospitalization or prolonged travel. No lower extremity swelling or pain. No calf tenderness.  ROS: See HPI Constitutional: no fever  Eyes: no drainage  ENT: no runny nose   Cardiovascular:   chest pain  Resp:  SOB  GI: no vomiting GU: no dysuria Integumentary: no rash  Allergy: no hives  Musculoskeletal: no leg swelling  Neurological: no slurred speech ROS otherwise negative  PAST MEDICAL HISTORY/PAST SURGICAL HISTORY:  Past Medical History:  Diagnosis Date  . Allergy     MEDICATIONS:  Prior to Admission medications   Medication Sig Start Date End Date Taking? Authorizing Provider  HYDROcodone-acetaminophen (NORCO/VICODIN) 5-325 MG tablet Take 2 tablets by mouth every 4 (four) hours as needed. 03/15/17   Geoffery Lyonselo, Douglas, MD  ibuprofen (ADVIL,MOTRIN) 200 MG tablet Take 200 mg by mouth every 8 (eight) hours as needed for pain.     [provider]  ondansetron (ZOFRAN ODT) 8 MG disintegrating tablet 8mg  ODT q4 hours prn nausea 03/15/17   Geoffery Lyonselo, Douglas, MD    ALLERGIES:  No Known Allergies  SOCIAL HISTORY:   Social History   Tobacco Use  . Smoking status: Never Smoker  . Smokeless tobacco: Never Used  Substance Use Topics  . Alcohol use: Yes    Comment: seldom    FAMILY HISTORY: Family History  Problem Relation Age of Onset  . Hypertension Mother     EXAM: BP (!) 143/97 (BP Location: Left Arm)   Pulse (!) 110   Temp 98 F (36.7 C) (Oral)   Resp 20   Ht 5\' 6"  (1.676 m)   Wt 108.9 kg   LMP 05/27/2019   SpO2 100%   BMI 38.74 kg/m  CONSTITUTIONAL: Alert and oriented and responds appropriately to questions.  Patient is obese.  She is afebrile.  She appears uncomfortable. HEAD: Normocephalic EYES: Conjunctivae clear, pupils appear equal, EOMI ENT: normal nose; moist mucous membranes NECK: Supple, no meningismus, no nuchal rigidity, no LAD, no midline spinal tenderness or step-off or deformity, patient is very tender to palpation over the left trapezius muscle without redness or warmth, swelling or ecchymosis CARD: Regular and tachycardic; S1 and S2 appreciated; no murmurs, no clicks, no rubs, no gallops CHEST:  Chest wall is tender to palpation.  No crepitus, ecchymosis, erythema, warmth, rash or other lesions present.   RESP: Patient is splinting.  She is tachypneic.  Her breath sounds are clear and equal bilaterally; no wheezes, no rhonchi, no rales, no hypoxia or respiratory distress, speaking full sentences ABD/GI: Normal bowel sounds; non-distended; soft, tender in the epigastric region and left upper quadrant, no  rebound, no guarding, no peritoneal signs, no hepatosplenomegaly, no tenderness at McBurney's point, negative Murphy sign BACK:  The back appears normal, there is no CVA tenderness, no midline spinal tenderness or step-off or deformity, tender over the posterior left trapezius muscle without redness, warmth, swelling or ecchymosis EXT: Normal ROM in all joints; non-tender to palpation; no edema; normal capillary refill; no cyanosis, no calf tenderness or swelling     SKIN: Normal color for age and race; warm; no rash NEURO: Moves all extremities equally, normal sensation diffusely, normal gait, normal speech PSYCH: The patient's mood and manner are appropriate. Grooming and personal hygiene are appropriate.  MEDICAL DECISION MAKING: Patient here with complaints of left shoulder pain.  This seems to be musculoskeletal in nature and likely related to her trapezius muscle spasm.  She is now however having chest pain or shortness of breath which may be related to this pain but she is quite tachycardic here and intermittently splinting.  She has no risk factors for PE other than obesity.  Given she is low risk, will obtain d-dimer.  Low suspicion for ACS.  EKG shows no ischemic abnormality.  Also complaining of abdominal pain and she is tender in the epigastric region and left upper quadrant.  Will obtain abdominal labs.  I do not feel she needs CT imaging at this point.  I do not think that she has cholecystitis, appendicitis, bowel obstruction, perforation.  Pancreatitis, pyelonephritis, gastritis on the differential.  Will give Toradol for discomfort and reassess.  ED PROGRESS: Patient's chest x-ray concerning for left lower lobe pneumonia.  She states she does [facility] [employer] [facility] and there have been positive coronavirus cases.  Will obtain COVID swab.  She has no hypoxia but does appear uncomfortable and has some splinting on exam.  She is still having some discomfort.  Will give morphine, Zofran, IV fluids.   She has been updated with plan.  Patient's labs unremarkable other than positive d-dimer.  Will obtain CTA of the chest.  1:30 AM  Pt reports her pain is improved after morphine.  She was able to ambulate to the bathroom when she comes back to the bed her heart rate is in the 110 is and she is tachypneic.  Sats initially 93% but quickly back up to 97%.  CTA chest pending.  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  She does state that she was tested last week as well because all of the employees were tested and she was negative at that time.  2:35 AM Discussed patient's case with hospitalist, Dr. Antionette Charpyd.  I have recommended admission and patient (and family if present) agree with this plan. Admitting physician will place admission orders.   I reviewed all nursing notes, vitals, pertinent previous records, EKGs, lab and urine results, imaging (as available).     EKG Interpretation  Date/Time:  Wednesday June 12 2019 23:09:38 EDT Ventricular Rate:  106 PR Interval:  128 QRS Duration: 78 QT Interval:  306 QTC Calculation: 406 R Axis:   51 Text Interpretation:  Sinus tachycardia Otherwise normal ECG No old tracing to compare Confirmed by Oyindamola Key, Baxter HireKristen [phone]([id number]) on 06/12/2019 11:18:36 PM        Jolene ProvostAryn Ladona Ridgelaylor was evaluated in Emergency Department on 06/13/2019 for the symptoms described in the history of present illness. She was evaluated in the context of  the global COVID-19 pandemic, which necessitated consideration that the patient might be at risk for infection with the SARS-CoV-2 virus that causes COVID-19. Institutional protocols and algorithms that pertain to the evaluation of patients at risk for COVID-19 are in a state of rapid change based on information released by regulatory bodies including the CDC and federal and state organizations. These policies and algorithms were followed during the patient's care in the ED.    CRITICAL CARE Performed by: Pryor Curia   Total critical care time: 65 minutes  Critical care time was exclusive of separately billable procedures and treating other patients.  Critical care was necessary to treat or prevent imminent or life-threatening deterioration.  Critical care was time spent personally by me on the following activities: development of treatment plan with patient and/or  surrogate as well as nursing, discussions with consultants, evaluation of patient's response to treatment, examination of patient, obtaining history from patient or surrogate, ordering and performing treatments and interventions, ordering and review of laboratory studies, ordering and review of radiographic studies, pulse oximetry and re-evaluation of patient's condition.    Andres Escandon, Delice Bison, DO 06/13/19 [phone]

## 2019-06-13 ENCOUNTER — Encounter (HOSPITAL_BASED_OUTPATIENT_CLINIC_OR_DEPARTMENT_OTHER): Payer: Self-pay

## 2019-06-13 ENCOUNTER — Emergency Department (HOSPITAL_BASED_OUTPATIENT_CLINIC_OR_DEPARTMENT_OTHER): Payer: BC Managed Care – PPO

## 2019-06-13 ENCOUNTER — Observation Stay (HOSPITAL_BASED_OUTPATIENT_CLINIC_OR_DEPARTMENT_OTHER): Payer: BC Managed Care – PPO

## 2019-06-13 DIAGNOSIS — I361 Nonrheumatic tricuspid (valve) insufficiency: Secondary | ICD-10-CM | POA: Diagnosis not present

## 2019-06-13 DIAGNOSIS — I2699 Other pulmonary embolism without acute cor pulmonale: Secondary | ICD-10-CM | POA: Diagnosis present

## 2019-06-13 DIAGNOSIS — N760 Acute vaginitis: Secondary | ICD-10-CM | POA: Diagnosis not present

## 2019-06-13 DIAGNOSIS — M25512 Pain in left shoulder: Secondary | ICD-10-CM | POA: Diagnosis not present

## 2019-06-13 DIAGNOSIS — Z79899 Other long term (current) drug therapy: Secondary | ICD-10-CM | POA: Diagnosis not present

## 2019-06-13 DIAGNOSIS — N390 Urinary tract infection, site not specified: Secondary | ICD-10-CM | POA: Diagnosis not present

## 2019-06-13 DIAGNOSIS — Z713 Dietary counseling and surveillance: Secondary | ICD-10-CM | POA: Diagnosis not present

## 2019-06-13 DIAGNOSIS — Z6838 Body mass index (BMI) 38.0-38.9, adult: Secondary | ICD-10-CM | POA: Diagnosis not present

## 2019-06-13 DIAGNOSIS — Z20828 Contact with and (suspected) exposure to other viral communicable diseases: Secondary | ICD-10-CM | POA: Diagnosis not present

## 2019-06-13 DIAGNOSIS — Z8249 Family history of ischemic heart disease and other diseases of the circulatory system: Secondary | ICD-10-CM | POA: Diagnosis not present

## 2019-06-13 LAB — URINALYSIS, ROUTINE W REFLEX MICROSCOPIC
Bilirubin Urine: NEGATIVE
Glucose, UA: NEGATIVE mg/dL
Hgb urine dipstick: NEGATIVE
Ketones, ur: NEGATIVE mg/dL
Nitrite: NEGATIVE
Protein, ur: NEGATIVE mg/dL
Specific Gravity, Urine: 1.02 (ref 1.005–1.030)
pH: 6 (ref 5.0–8.0)

## 2019-06-13 LAB — CBC
HCT: 37.5 % (ref 36.0–46.0)
Hemoglobin: 12.2 g/dL (ref 12.0–15.0)
MCH: 29.3 pg (ref 26.0–34.0)
MCHC: 32.5 g/dL (ref 30.0–36.0)
MCV: 90.1 fL (ref 80.0–100.0)
Platelets: 274 10*3/uL (ref 150–400)
RBC: 4.16 MIL/uL (ref 3.87–5.11)
RDW: 13.9 % (ref 11.5–15.5)
WBC: 8.7 10*3/uL (ref 4.0–10.5)
nRBC: 0 % (ref 0.0–0.2)

## 2019-06-13 LAB — COMPREHENSIVE METABOLIC PANEL WITH GFR
ALT: 14 U/L (ref 0–44)
AST: 18 U/L (ref 15–41)
Albumin: 3.7 g/dL (ref 3.5–5.0)
Alkaline Phosphatase: 77 U/L (ref 38–126)
Anion gap: 8 (ref 5–15)
BUN: 14 mg/dL (ref 6–20)
CO2: 22 mmol/L (ref 22–32)
Calcium: 8.9 mg/dL (ref 8.9–10.3)
Chloride: 106 mmol/L (ref 98–111)
Creatinine, Ser: 0.73 mg/dL (ref 0.44–1.00)
GFR calc Af Amer: >60 mL/min
GFR calc non Af Amer: >60 mL/min
Glucose, Bld: 117 mg/dL — ABNORMAL HIGH (ref 70–99)
Potassium: 3.8 mmol/L (ref 3.5–5.1)
Sodium: 136 mmol/L (ref 135–145)
Total Bilirubin: 0.2 mg/dL — ABNORMAL LOW (ref 0.3–1.2)
Total Protein: 7.9 g/dL (ref 6.5–8.1)

## 2019-06-13 LAB — D-DIMER, QUANTITATIVE: D-Dimer, Quant: 2.82 ug{FEU}/mL — ABNORMAL HIGH (ref 0.00–0.50)

## 2019-06-13 LAB — URINALYSIS, MICROSCOPIC (REFLEX)

## 2019-06-13 LAB — CBC WITH DIFFERENTIAL/PLATELET
Abs Immature Granulocytes: 0.02 10*3/uL (ref 0.00–0.07)
Basophils Absolute: 0 10*3/uL (ref 0.0–0.1)
Basophils Relative: 0 %
Eosinophils Absolute: 0.2 10*3/uL (ref 0.0–0.5)
Eosinophils Relative: 2 %
HCT: 37.6 % (ref 36.0–46.0)
Hemoglobin: 12.5 g/dL (ref 12.0–15.0)
Immature Granulocytes: 0 %
Lymphocytes Relative: 20 %
Lymphs Abs: 1.9 10*3/uL (ref 0.7–4.0)
MCH: 29.2 pg (ref 26.0–34.0)
MCHC: 33.2 g/dL (ref 30.0–36.0)
MCV: 87.9 fL (ref 80.0–100.0)
Monocytes Absolute: 1 10*3/uL (ref 0.1–1.0)
Monocytes Relative: 10 %
Neutro Abs: 6.2 10*3/uL (ref 1.7–7.7)
Neutrophils Relative %: 68 %
Platelets: 304 10*3/uL (ref 150–400)
RBC: 4.28 MIL/uL (ref 3.87–5.11)
RDW: 13.8 % (ref 11.5–15.5)
WBC: 9.2 10*3/uL (ref 4.0–10.5)
nRBC: 0 % (ref 0.0–0.2)

## 2019-06-13 LAB — LIPASE, BLOOD: Lipase: 45 U/L (ref 11–51)

## 2019-06-13 LAB — TROPONIN I: Troponin I: <0.03 ng/mL (ref <0.03)

## 2019-06-13 LAB — HEPARIN LEVEL (UNFRACTIONATED)
Heparin Unfractionated: 0.4 IU/mL (ref 0.30–0.70)
Heparin Unfractionated: 0.3 IU/mL (ref 0.30–0.70)
Heparin Unfractionated: 0.21 IU/mL — ABNORMAL LOW (ref 0.30–0.70)

## 2019-06-13 LAB — ECHOCARDIOGRAM COMPLETE
Height: 66 in
Weight: 3840 oz

## 2019-06-13 LAB — PREGNANCY, URINE: Preg Test, Ur: NEGATIVE

## 2019-06-13 LAB — SARS CORONAVIRUS 2 AG (30 MIN TAT): SARS Coronavirus 2 Ag: NEGATIVE

## 2019-06-13 LAB — SARS CORONAVIRUS 2 BY RT PCR (HOSPITAL ORDER, PERFORMED IN ~~LOC~~ HOSPITAL LAB): SARS Coronavirus 2: NEGATIVE

## 2019-06-13 MED ORDER — HYDROCODONE-ACETAMINOPHEN 5-325 MG PO TABS
2.0000 | ORAL_TABLET | ORAL | Status: DC | PRN
  Administered 2019-06-13 – 2019-06-16 (×10): 2 via ORAL
  Filled 2019-06-13 (×11): qty 2

## 2019-06-13 MED ORDER — ONDANSETRON HCL 4 MG/2ML IJ SOLN
4.0000 mg | Freq: Once | INTRAMUSCULAR | Status: AC
  Administered 2019-06-13: 4 mg via INTRAVENOUS
  Filled 2019-06-13: qty 2

## 2019-06-13 MED ORDER — DOXYCYCLINE HYCLATE 100 MG PO TABS: 100.0000 mg | ORAL_TABLET | Freq: Once | ORAL | Status: DC

## 2019-06-13 MED ORDER — HEPARIN (PORCINE) 25000 UT/250ML-% IV SOLN
1250.0000 [IU]/h | INTRAVENOUS | Status: DC
  Administered 2019-06-13: 1250 [IU]/h via INTRAVENOUS
  Filled 2019-06-13: qty 250

## 2019-06-13 MED ORDER — IBUPROFEN 200 MG PO TABS
600.0000 mg | ORAL_TABLET | Freq: Four times a day (QID) | ORAL | Status: DC | PRN
  Administered 2019-06-13: 600 mg via ORAL
  Filled 2019-06-13: qty 3

## 2019-06-13 MED ORDER — HEPARIN (PORCINE) 25000 UT/250ML-% IV SOLN
1300.0000 [IU]/h | INTRAVENOUS | Status: DC
  Administered 2019-06-13: 1300 [IU]/h via INTRAVENOUS
  Filled 2019-06-13 (×2): qty 250

## 2019-06-13 MED ORDER — HEPARIN BOLUS VIA INFUSION
5000.0000 [IU] | Freq: Once | INTRAVENOUS | Status: AC
  Administered 2019-06-13: 5000 [IU] via INTRAVENOUS

## 2019-06-13 MED ORDER — MORPHINE SULFATE (PF) 4 MG/ML IV SOLN
4.0000 mg | Freq: Once | INTRAVENOUS | Status: AC
  Administered 2019-06-13: 03:00:00 4 mg via INTRAVENOUS
  Filled 2019-06-13: qty 1

## 2019-06-13 MED ORDER — IOHEXOL 350 MG/ML SOLN
75.0000 mL | Freq: Once | INTRAVENOUS | Status: AC | PRN
  Administered 2019-06-13: 02:00:00 via INTRAVENOUS

## 2019-06-13 MED ORDER — SODIUM CHLORIDE 0.9 % IV BOLUS (SEPSIS)
1000.0000 mL | Freq: Once | INTRAVENOUS | Status: AC
  Administered 2019-06-13: 1000 mL via INTRAVENOUS

## 2019-06-13 MED ORDER — MORPHINE SULFATE (PF) 4 MG/ML IV SOLN
4.0000 mg | Freq: Once | INTRAVENOUS | Status: AC
  Administered 2019-06-13: 4 mg via INTRAVENOUS
  Filled 2019-06-13: qty 1

## 2019-06-13 NOTE — ED Notes (Signed)
ED Provider at bedside. 

## 2019-06-13 NOTE — Progress Notes (Signed)
  Echocardiogram 2D Echocardiogram has been performed.  Alice Henry 06/13/2019, 3:38 PM

## 2019-06-13 NOTE — Progress Notes (Signed)
ANTICOAGULATION CONSULT NOTE  Pharmacy Consult:  Heparin Indication: pulmonary embolus  No Known Allergies  Patient Measurements: Height: 5\' 6"  (167.6 cm) Weight: 240 lb (108.9 kg) IBW/kg (Calculated) : 59.3 Heparin Dosing Weight: 85 kg  Vital Signs: Temp: 98.2 F (36.8 C) (06/18 0619) Temp Source: Oral (06/18 0619) BP: 117/77 (06/18 0619) Pulse Rate: 85 (06/18 0619)  Labs: Recent Labs    06/12/19 2330 06/12/19 2356 06/13/19 0921  HGB  --  12.5  --   HCT  --  37.6  --   PLT  --  304  --   HEPARINUNFRC  --   --  0.40  CREATININE  --  0.73  --   TROPONINI <0.03  --   --     Estimated Creatinine Clearance: 127.2 mL/min (by C-G formula based on SCr of 0.73 mg/dL).   Medical History: Past Medical History:  Diagnosis Date  . Allergy     Assessment: 35 YOF presented with neck and shoulder pain that cause chest tightness.  CTA showed acute bilateral PEs and Pharmacy consulted to dose IV heparin.  Baseline labs and PMH reviewed.  06/13/2019:  Initial heparin level therapeutic (0.40) on 1250 units/hr (goal 0.3-0.7)  CBC: WNL  No bleeding or infusion related issues reported by RN  Goal of Therapy:  Heparin level 0.3-0.7 units/ml Monitor platelets by anticoagulation protocol: Yes   Plan:  Continue heparin gtt at 1250 units/hr Recheck confirmatory heparin level in 6h Daily heparin level and CBC while on heparin Noted plans to transition to [place] hopefully 6/19  Netta Cedars, PharmD, BCPS 06/13/2019, 10:02 AM

## 2019-06-13 NOTE — ED Notes (Signed)
Patient transported to CT 

## 2019-06-13 NOTE — H&P (Addendum)
History and Physical    Nakiya Rallis NLZ:[id number] DOB: [birth year] DOA: 06/12/2019  PCP: Laurel Dimmer, MD   Patient coming from: [facility]   Chief Complaint: Shortness of breath, left shoulder pain  HPI: Alice Henry is a 33 y.o. female with no significant past medical history who presents to the [place] emergency department with complaints of shortness of breath, chest pain and left shoulder pain that started last Monday evening.  She is usually a healthy female and does not take medications at home except that she was taking oral antibiotics for her vaginal infection for last few days.  She went to urgent care center for the evaluation of above symptoms and was treated with muscle relaxants which did not help.  So she went to [place].  She was found to have elevated d-dimer.  Chest CT angiogram done there showed bilateral pulmonary embolism.  Patient was also complaining of chest tightness and severe right shoulder pain. Patient seen and examined the bedside.  Currently she is comfortable, on room air.  Denies any shortness of breath.  She denies any family history of clotting disorders.  She denies any prolonged immobilization, recent trauma, surgery or estrogen use.  Denies any abdominal pain, dysuria, nausea, vomiting, fever, chills leg swelling or exposure to any person who was tested positive for COVID-19  ED Course: Started on heparin drip. Abbott COVID screening test negative.  Review of Systems: As per HPI otherwise 10 point review of systems negative.    Past Medical History:  Diagnosis Date  . Allergy     History reviewed. No pertinent surgical history.   reports that she has never smoked. She has never used smokeless tobacco. She reports current alcohol use. She reports that she does not use drugs.  No Known Allergies  Family History  Problem Relation Age of Onset  . Hypertension Mother      Prior to Admission medications   Medication Sig  Start Date End Date Taking? Authorizing Provider  HYDROcodone-acetaminophen (NORCO/VICODIN) 5-325 MG tablet Take 2 tablets by mouth every 4 (four) hours as needed. 03/15/17   Veryl Speak, MD  ibuprofen (ADVIL,MOTRIN) 200 MG tablet Take 200 mg by mouth every 8 (eight) hours as needed for pain.     [provider]  ondansetron (ZOFRAN ODT) 8 MG disintegrating tablet 8mg  ODT q4 hours prn nausea 03/15/17   Veryl Speak, MD    Physical Exam: Vitals:   06/13/19 0400 06/13/19 0515 06/13/19 0525 06/13/19 0619  BP:   117/61 117/77  Pulse: 87 84 91 85  Resp: 18 17 (!) 21 16  Temp:   98.2 F (36.8 C) 98.2 F (36.8 C)  TempSrc:   Oral Oral  SpO2: 100% 98% 100% 100%  Weight:      Height:        Constitutional: Morbidly obese, calm, comfortable Vitals:   06/13/19 0400 06/13/19 0515 06/13/19 0525 06/13/19 0619  BP:   117/61 117/77  Pulse: 87 84 91 85  Resp: 18 17 (!) 21 16  Temp:   98.2 F (36.8 C) 98.2 F (36.8 C)  TempSrc:   Oral Oral  SpO2: 100% 98% 100% 100%  Weight:      Height:       Eyes: PERRL, lids and conjunctivae normal ENMT: Mucous membranes are moist. Posterior pharynx clear of any exudate or lesions.Normal dentition.  Neck: normal, supple, no masses, no thyromegaly Respiratory: clear to auscultation bilaterally, no wheezing, no crackles. Normal  respiratory effort. No accessory muscle use.  Cardiovascular: Regular rate and rhythm, no murmurs / rubs / gallops. No extremity edema. 2+ pedal pulses. No carotid bruits.  Abdomen: no tenderness, no masses palpated. No hepatosplenomegaly. Bowel sounds positive.  Musculoskeletal: no clubbing / cyanosis. No joint deformity upper and lower extremities. Good ROM, no contractures. Normal muscle tone.  Skin: no rashes, lesions, ulcers. No induration Neurologic: CN 2-12 grossly intact. Sensation intact, DTR normal. Strength 5/5 in all 4.  Psychiatric: Normal judgment and insight. Alert and oriented x 3. Normal mood.   Foley  Catheter:None  Labs on Admission: I have personally reviewed following labs and imaging studies  CBC: Recent Labs  Lab 06/12/19 2356  WBC 9.2  NEUTROABS 6.2  HGB 12.5  HCT 37.6  MCV 87.9  PLT 304   Basic Metabolic Panel: Recent Labs  Lab 06/12/19 2356  NA 136  K 3.8  CL 106  CO2 22  GLUCOSE 117*  BUN 14  CREATININE 0.73  CALCIUM 8.9   GFR: Estimated Creatinine Clearance: 127.2 mL/min (by C-G formula based on SCr of 0.73 mg/dL). Liver Function Tests: Recent Labs  Lab 06/12/19 2356  AST 18  ALT 14  ALKPHOS 77  BILITOT 0.2*  PROT 7.9  ALBUMIN 3.7   Recent Labs  Lab 06/12/19 2356  LIPASE 45   No results for input(s): AMMONIA in the last 168 hours. Coagulation Profile: No results for input(s): INR, PROTIME in the last 168 hours. Cardiac Enzymes: Recent Labs  Lab 06/12/19 2330  TROPONINI <0.03   BNP (last 3 results) No results for input(s): PROBNP in the last 8760 hours. HbA1C: No results for input(s): HGBA1C in the last 72 hours. CBG: No results for input(s): GLUCAP in the last 168 hours. Lipid Profile: No results for input(s): CHOL, HDL, LDLCALC, TRIG, CHOLHDL, LDLDIRECT in the last 72 hours. Thyroid Function Tests: No results for input(s): TSH, T4TOTAL, FREET4, T3FREE, THYROIDAB in the last 72 hours. Anemia Panel: No results for input(s): VITAMINB12, FOLATE, FERRITIN, TIBC, IRON, RETICCTPCT in the last 72 hours. Urine analysis:    Component Value Date/Time   COLORURINE YELLOW 06/13/2019 0036   APPEARANCEUR CLEAR 06/13/2019 0036   LABSPEC 1.020 06/13/2019 0036   PHURINE 6.0 06/13/2019 0036   GLUCOSEU NEGATIVE 06/13/2019 0036   HGBUR NEGATIVE 06/13/2019 0036   BILIRUBINUR NEGATIVE 06/13/2019 0036   BILIRUBINUR neg 01/09/2013 1840   KETONESUR NEGATIVE 06/13/2019 0036   PROTEINUR NEGATIVE 06/13/2019 0036   UROBILINOGEN 0.2 01/09/2013 1840   NITRITE NEGATIVE 06/13/2019 0036   LEUKOCYTESUR TRACE (A) 06/13/2019 0036    Radiological Exams on  Admission: Dg Chest 2 View  Result Date: 06/12/2019 CLINICAL DATA:  Chest pain and shortness of breath. EXAM: CHEST - 2 VIEW COMPARISON:  02/20/2013 FINDINGS: Low lung volumes. Heart is normal in size. Normal mediastinal contours. Small left pleural effusion and basilar atelectasis/airspace disease. Mild peribronchial thickening. No pneumothorax. IMPRESSION: Small left pleural effusion and left basilar atelectasis/airspace disease. Electronically Signed   By: Narda RutherfordMelanie  Sanford M.D.   On: 06/12/2019 23:46   Ct Angio Chest Pe W And/or Wo Contrast  Result Date: 06/13/2019 CLINICAL DATA:  PE suspected, intermediate prob, positive D-dimer. Left chest pain. EXAM: CT ANGIOGRAPHY CHEST WITH CONTRAST TECHNIQUE: Multidetector CT imaging of the chest was performed using the standard protocol during bolus administration of intravenous contrast. Multiplanar CT image reconstructions and MIPs were obtained to evaluate the vascular anatomy. CONTRAST:  75 cc OMNIPAQUE IOHEXOL 350 MG/ML SOLN COMPARISON:  Radiograph yesterday FINDINGS:  Cardiovascular: Examination is positive for pulmonary emboli. Filling defects in the left lower lobar pulmonary artery extend into the segmental and subsegmental branches, greatest thromboembolic burden to the lateral basal left lower lobe. Small subsegmental filling defect also seen in the subsegmental right lower lobe pulmonary artery. Thromboembolic burden is small to moderate. No right heart strain, RV to LV ratio is 0.6. thoracic aorta is normal in caliber without dissection. No pericardial effusion. Mediastinum/Nodes: Enlarged mediastinal or hilar lymph nodes. No visualized thyroid nodule. Esophagus slightly patulous. No esophageal wall thickening. Lungs/Pleura: Small left pleural effusion. Streaky and subpleural left lower lobe opacities likely combination of pulmonary infarct and atelectasis. Right lung is clear. Trachea and mainstem bronchi are patent. Upper Abdomen: No acute abnormality.  Musculoskeletal: There are no acute or suspicious osseous abnormalities. Review of the MIP images confirms the above findings. IMPRESSION: 1. Positive for bilateral pulmonary emboli, thromboembolic burden is small to moderate. No right heart strain. 2. Left lower lobe opacities likely combination of pulmonary infarct and atelectasis. Small left pleural effusion. Critical Value/emergent results were called by telephone at the time of interpretation on 06/13/2019 at 2:18 am to Dr. Rochele RaringKRISTEN WARD , who verbally acknowledged these results. Electronically Signed   By: Narda RutherfordMelanie  Sanford M.D.   On: 06/13/2019 02:18     Assessment/Plan Principal Problem:   Acute pulmonary embolism (HCC) Active Problems:   Pulmonary emboli (HCC)    Acute pulmonary embolism: No associated risk factors. CT angiograms showed bilateral pulmonary embolism, small to moderate thromboembolic burden, no  right heart strain. No leg swelling.  No calf tenderness. Showed left lower lobe opacities most likely associated with pulmonary infarct or atelectasis secondary to pulmonary embolism. Though there is no right heart strain, will get an echocardiogram for this young lady. Currently respiratory status stable.  Saturating fine on room air. If she remains hemodynamically stable tomorrow and can ambulate without any problem, most likely we can start on DOAC and plan for D/C.  Left shoulder pain: CT did not show any acute osseous abnormalities.  Most likely associated with pulmonary embolism.  Continue supportive care, pain management  Morbid obesity: BMI of 38  Severity of Illness: The appropriate patient status for this patient is OBSERVATION. Observation status is judged to be reasonable and necessary in o    DVT prophylaxis: Heparin IV Code Status: Full Family Communication: None present at the bedside Consults called: None     Burnadette PopAmrit Vesper Trant MD [facility] Pager [phone]  If 7PM-7AM, please contact  night-coverage [email or web address] Password [email or web address]  06/13/2019, 7:31 AM

## 2019-06-13 NOTE — Progress Notes (Signed)
ANTICOAGULATION CONSULT NOTE - Initial Consult  Pharmacy Consult:  Heparin Indication: pulmonary embolus  No Known Allergies  Patient Measurements: Height: 5\' 6"  (167.6 cm) Weight: 240 lb (108.9 kg) IBW/kg (Calculated) : 59.3 Heparin Dosing Weight: 85 kg  Vital Signs: Temp: 98.7 F (37.1 C) (06/18 0208) Temp Source: Oral (06/18 0208) BP: 116/60 (06/18 0208) Pulse Rate: 96 (06/18 0208)  Labs: Recent Labs    06/12/19 2330 06/12/19 2356  HGB  --  12.5  HCT  --  37.6  PLT  --  304  CREATININE  --  0.73  TROPONINI <0.03  --     Estimated Creatinine Clearance: 127.2 mL/min (by C-G formula based on SCr of 0.73 mg/dL).   Medical History: Past Medical History:  Diagnosis Date  . Allergy       Assessment: 41 YOF presented with neck and shoulder pain that cause chest tightness.  CTA showed acute bilateral PEs and Pharmacy consulted to dose IV heparin.  Baseline labs and old me history reviewed.  Goal of Therapy:  Heparin level 0.3-0.7 units/ml Monitor platelets by anticoagulation protocol: Yes   Plan:  Heparin 5000 units IV bolus, then Heparin gtt at 1250 units/hr Check 6 hr heparin level Daily heparin level and CBC  Ronya Gilcrest D. Mina Marble, PharmD, BCPS, [place] 06/13/2019, 2:24 AM

## 2019-06-13 NOTE — Progress Notes (Signed)
ANTICOAGULATION CONSULT NOTE  Pharmacy Consult:  Heparin Indication: pulmonary embolus  No Known Allergies  Patient Measurements: Height: 5\' 6"  (167.6 cm) Weight: 240 lb (108.9 kg) IBW/kg (Calculated) : 59.3 Heparin Dosing Weight: 85 kg  Vital Signs: Temp: 98 F (36.7 C) (06/18 1246) Temp Source: Oral (06/18 1246) BP: 120/77 (06/18 1246) Pulse Rate: 88 (06/18 1246)  Labs: Recent Labs    06/12/19 2330 06/12/19 2356 06/13/19 0921 06/13/19 1628  HGB  --  12.5  --   --   HCT  --  37.6  --   --   PLT  --  304  --   --   HEPARINUNFRC  --   --  0.40 0.30  CREATININE  --  0.73  --   --   TROPONINI <0.03  --   --   --     Estimated Creatinine Clearance: 127.2 mL/min (by C-G formula based on SCr of 0.73 mg/dL).   Medical History: Past Medical History:  Diagnosis Date  . Allergy     Assessment: 78 YOF presented with neck and shoulder pain that cause chest tightness.  CTA showed acute bilateral PEs and Pharmacy consulted to dose IV heparin.  Baseline labs and PMH reviewed.  06/13/2019:  Confirmatory heparin level is on lower end of therapeutic range (0.30) on 1250 units/hr (goal 0.3-0.7)  CBC: WNL  No bleeding or infusion related issues reported by RN. Confirmed heparin infusing at correct rate and no interruptions in infusion.  Goal of Therapy:  Heparin level 0.3-0.7 units/ml Monitor platelets by anticoagulation protocol: Yes   Plan:   Slightly increase heparin drip to 1300 units/hr  Recheck HL and CBC in 6 hours  Daily heparin level and CBC while on heparin  Noted plans to transition to [facility]AC hopefully 6/19  Lenis Noon, PharmD 06/13/19

## 2019-06-14 ENCOUNTER — Observation Stay (HOSPITAL_COMMUNITY): Payer: BC Managed Care – PPO

## 2019-06-14 DIAGNOSIS — Z79899 Other long term (current) drug therapy: Secondary | ICD-10-CM | POA: Diagnosis not present

## 2019-06-14 DIAGNOSIS — R609 Edema, unspecified: Secondary | ICD-10-CM | POA: Diagnosis not present

## 2019-06-14 DIAGNOSIS — N760 Acute vaginitis: Secondary | ICD-10-CM | POA: Diagnosis not present

## 2019-06-14 DIAGNOSIS — M25512 Pain in left shoulder: Secondary | ICD-10-CM | POA: Diagnosis not present

## 2019-06-14 DIAGNOSIS — Z6838 Body mass index (BMI) 38.0-38.9, adult: Secondary | ICD-10-CM | POA: Diagnosis not present

## 2019-06-14 DIAGNOSIS — N390 Urinary tract infection, site not specified: Secondary | ICD-10-CM | POA: Diagnosis not present

## 2019-06-14 DIAGNOSIS — Z20828 Contact with and (suspected) exposure to other viral communicable diseases: Secondary | ICD-10-CM | POA: Diagnosis not present

## 2019-06-14 DIAGNOSIS — Z8249 Family history of ischemic heart disease and other diseases of the circulatory system: Secondary | ICD-10-CM | POA: Diagnosis not present

## 2019-06-14 DIAGNOSIS — I2699 Other pulmonary embolism without acute cor pulmonale: Principal | ICD-10-CM

## 2019-06-14 DIAGNOSIS — Z713 Dietary counseling and surveillance: Secondary | ICD-10-CM | POA: Diagnosis not present

## 2019-06-14 LAB — CBC
HCT: 37.7 % (ref 36.0–46.0)
Hemoglobin: 12.1 g/dL (ref 12.0–15.0)
MCH: 28.5 pg (ref 26.0–34.0)
MCHC: 32.1 g/dL (ref 30.0–36.0)
MCV: 88.9 fL (ref 80.0–100.0)
Platelets: 300 10*3/uL (ref 150–400)
RBC: 4.24 MIL/uL (ref 3.87–5.11)
RDW: 13.7 % (ref 11.5–15.5)
WBC: 8.4 10*3/uL (ref 4.0–10.5)
nRBC: 0 % (ref 0.0–0.2)

## 2019-06-14 LAB — HEPARIN LEVEL (UNFRACTIONATED): Heparin Unfractionated: 0.6 IU/mL (ref 0.30–0.70)

## 2019-06-14 LAB — HIV ANTIBODY (ROUTINE TESTING W REFLEX): HIV Screen 4th Generation wRfx: NONREACTIVE

## 2019-06-14 LAB — TROPONIN I
Troponin I: <0.03 ng/mL (ref <0.03)
Troponin I: <0.03 ng/mL (ref <0.03)

## 2019-06-14 LAB — BRAIN NATRIURETIC PEPTIDE: B Natriuretic Peptide: 32.3 pg/mL (ref 0.0–100.0)

## 2019-06-14 LAB — LACTIC ACID, PLASMA
Lactic Acid, Venous: 0.6 mmol/L (ref 0.5–1.9)
Lactic Acid, Venous: 0.8 mmol/L (ref 0.5–1.9)

## 2019-06-14 LAB — ANTITHROMBIN III: AntiThromb III Func: 63 % — ABNORMAL LOW (ref 75–120)

## 2019-06-14 MED ORDER — HEPARIN (PORCINE) 25000 UT/250ML-% IV SOLN
1500.0000 [IU]/h | INTRAVENOUS | Status: DC
  Administered 2019-06-14: 1500 [IU]/h via INTRAVENOUS

## 2019-06-14 MED ORDER — HEPARIN BOLUS VIA INFUSION
2000.0000 [IU] | Freq: Once | INTRAVENOUS | Status: AC
  Administered 2019-06-14: 2000 [IU] via INTRAVENOUS
  Filled 2019-06-14: qty 2000

## 2019-06-14 MED ORDER — APIXABAN 5 MG PO TABS
10.0000 mg | ORAL_TABLET | Freq: Two times a day (BID) | ORAL | Status: DC
  Administered 2019-06-14: 10 mg via ORAL
  Filled 2019-06-14: qty 4

## 2019-06-14 MED ORDER — METRONIDAZOLE 500 MG PO TABS
500.0000 mg | ORAL_TABLET | Freq: Two times a day (BID) | ORAL | Status: DC
  Administered 2019-06-14 – 2019-06-16 (×5): 500 mg via ORAL
  Filled 2019-06-14 (×5): qty 1

## 2019-06-14 MED ORDER — SENNOSIDES-DOCUSATE SODIUM 8.6-50 MG PO TABS
1.0000 | ORAL_TABLET | Freq: Two times a day (BID) | ORAL | Status: DC
  Administered 2019-06-14 – 2019-06-16 (×5): 1 via ORAL
  Filled 2019-06-14 (×5): qty 1

## 2019-06-14 MED ORDER — HEPARIN (PORCINE) 25000 UT/250ML-% IV SOLN
1500.0000 [IU]/h | INTRAVENOUS | Status: DC
  Administered 2019-06-15 (×2): 1500 [IU]/h via INTRAVENOUS
  Filled 2019-06-14 (×2): qty 250

## 2019-06-14 MED ORDER — APIXABAN 5 MG PO TABS: 5.0000 mg | ORAL_TABLET | Freq: Two times a day (BID) | ORAL | Status: DC

## 2019-06-14 MED ORDER — POLYETHYLENE GLYCOL 3350 17 G PO PACK
17.0000 g | PACK | Freq: Every day | ORAL | Status: DC
  Administered 2019-06-14: 17 g via ORAL
  Filled 2019-06-14: qty 1

## 2019-06-14 NOTE — Progress Notes (Signed)
Pt expresses sudden chest pain radiating to their left flank and back rated 9-10/10 at 1638 this shift. EKG performed, O2 and Hydrocodone administered, vital signs performed, and Dr. Tyrell Antonio was notified at the time of the event. All vital signs returned within normal limits. EKG showed normal sinus rhythm with no acute changes and O2 saturation was 100% on room air. Troponin draw has been ordered, will follow result. Will continue to monitor vital signs and pt condition.

## 2019-06-14 NOTE — Discharge Instructions (Signed)
Information on my medicine - ELIQUIS (apixaban)  This medication education was reviewed with me or my healthcare representative as part of my discharge preparation.  The pharmacist that spoke with me during my hospital stay was:  Natale Lay, Student-PharmD  Why was Eliquis prescribed for you? Eliquis was prescribed to treat blood clots that may have been found in the veins of your legs (deep vein thrombosis) or in your lungs (pulmonary embolism) and to reduce the risk of them occurring again.  What do You need to know about Eliquis ? The starting dose is 10 mg (two 5 mg tablets) taken TWICE daily for the FIRST SEVEN (7) DAYS, then on 06/21/2019  the dose is reduced to ONE 5 mg tablet taken TWICE daily.  Eliquis may be taken with or without food.   Try to take the dose about the same time in the morning and in the evening. If you have difficulty swallowing the tablet whole please discuss with your pharmacist how to take the medication safely.  Take Eliquis exactly as prescribed and DO NOT stop taking Eliquis without talking to the doctor who prescribed the medication.  Stopping may increase your risk of developing a new blood clot.  Refill your prescription before you run out.  After discharge, you should have regular check-up appointments with your healthcare provider that is prescribing your Eliquis.    What do you do if you miss a dose? If a dose of ELIQUIS is not taken at the scheduled time, take it as soon as possible on the same day and twice-daily administration should be resumed. The dose should not be doubled to make up for a missed dose.  Important Safety Information A possible side effect of Eliquis is bleeding. You should call your healthcare provider right away if you experience any of the following: ? Bleeding from an injury or your nose that does not stop. ? Unusual colored urine (red or dark brown) or unusual colored stools (red or black). ? Unusual bruising for  unknown reasons. ? A serious fall or if you hit your head (even if there is no bleeding).  Some medicines may interact with Eliquis and might increase your risk of bleeding or clotting while on Eliquis. To help avoid this, consult your healthcare provider or pharmacist prior to using any new prescription or non-prescription medications, including herbals, vitamins, non-steroidal anti-inflammatory drugs (NSAIDs) and supplements.  This website has more information on Eliquis (apixaban): http://www.eliquis.com/eliquis/home

## 2019-06-14 NOTE — Progress Notes (Addendum)
ANTICOAGULATION CONSULT NOTE  Pharmacy Consult:  Heparin Indication: pulmonary embolus  No Known Allergies  Patient Measurements: Height: 5\' 6"  (167.6 cm) Weight: 240 lb (108.9 kg) IBW/kg (Calculated) : 59.3 Heparin Dosing Weight: 85 kg  Vital Signs: Temp: 98.3 F (36.8 C) (06/19 0516) Temp Source: Oral (06/19 0516) BP: 114/72 (06/19 0516) Pulse Rate: 95 (06/19 0516)  Labs: Recent Labs    06/12/19 2330 06/12/19 2356  06/13/19 1628 06/13/19 2241 06/14/19 0718  HGB  --  12.5  --   --  12.2  --   HCT  --  37.6  --   --  37.5  --   PLT  --  304  --   --  274  --   HEPARINUNFRC  --   --    < > 0.30 0.21* 0.60  CREATININE  --  0.73  --   --   --   --   TROPONINI <0.03  --   --   --   --   --    < > = values in this interval not displayed.    Estimated Creatinine Clearance: 127.2 mL/min (by C-G formula based on SCr of 0.73 mg/dL).   Medical History: Past Medical History:  Diagnosis Date  . Allergy     Assessment: 69 YOF presented with neck and shoulder pain that cause chest tightness.  CTA showed acute bilateral PEs and Pharmacy consulted to dose IV heparin.  Baseline labs and PMH reviewed.  06/14/2019:  Heparin level therapeutic (0.60) on 1500 units/hr (goal 0.3-0.7)  CBC: WNL  No bleeding or infusion related issues reported by RN  Goal of Therapy:  Heparin level 0.3-0.7 units/ml Monitor platelets by anticoagulation protocol: Yes   Plan:  Continue heparin gtt at 1500 units/hr Recheck confirmatory heparin level in 6h Daily heparin level and CBC while on heparin Noted plans to transition to [place] hopefully today  Netta Cedars, PharmD, BCPS 06/14/2019, 7:57 AM  Noted plan to change to Eliquis 10mg  PO BID x7 days then 5mg  PO BID D/C heparin & heparin labs Provide education & manufacturer coupon for new medication.   Netta Cedars, PharmD, BCPS 06/14/2019 at 11:43 AM

## 2019-06-14 NOTE — Progress Notes (Signed)
LE venous duplex       has been completed. Preliminary results can be found under CV proc through chart review. Sarae Nicholes, BS, RDMS, RVT   

## 2019-06-14 NOTE — Progress Notes (Addendum)
ANTICOAGULATION CONSULT NOTE  Pharmacy Consult:  Heparin Indication: pulmonary embolus  No Known Allergies  Patient Measurements: Height: 5\' 6"  (167.6 cm) Weight: 240 lb (108.9 kg) IBW/kg (Calculated) : 59.3 Heparin Dosing Weight: 85 kg  Vital Signs: Temp: 97.2 F (36.2 C) (06/19 1806) Temp Source: Oral (06/19 1806) BP: 122/73 (06/19 1806) Pulse Rate: 86 (06/19 1806)  Labs: Recent Labs    06/12/19 2330  06/12/19 2356  06/13/19 1628 06/13/19 2241 06/14/19 0718 06/14/19 1726  HGB  --    < > 12.5  --   --  12.2 12.1  --   HCT  --   --  37.6  --   --  37.5 37.7  --   PLT  --   --  304  --   --  274 300  --   HEPARINUNFRC  --   --   --    < > 0.30 0.21* 0.60  --   CREATININE  --   --  0.73  --   --   --   --   --   TROPONINI <0.03  --   --   --   --   --   --  <0.03   < > = values in this interval not displayed.    Estimated Creatinine Clearance: 127.2 mL/min (by C-G formula based on SCr of 0.73 mg/dL).   Medical History: Past Medical History:  Diagnosis Date  . Allergy     Assessment: 59 YOF presented with neck and shoulder pain that cause chest tightness.  CTA showed acute bilateral PEs and Pharmacy consulted to dose IV heparin.  Baseline labs and PMH reviewed.  Patient was converted from IV heparin to apixaban earlier today. Received consult from MD to convert anticoagulation back to heparin drip.  06/14/2019:  Apixaban 10 mg dose was given 6/19 @ 1310  Heparin level was therapeutic (0.60) on previous rate of 1500 units/hr (goal 0.3-0.7)  CBC: WNL - stable  No bleeding or infusion related issues noted  Goal of Therapy:  Heparin level 0.3-0.7 units/ml Monitor platelets by anticoagulation protocol: Yes   Plan:   Will restart heparin drip 12 hours after apixaban dose  Resume heparin at previously therapeutic rate of 1500 units/hr. No initial bolus as patient anticoagulated with apixaban currently  Check 6 hour HL and aPTT.   HL can be falsely  elevated from effects of apixaban so will need to ensure that HL and aPTT correlate   Daily HL and CBC while on heparin infusion  Patient was provided with manufacturer coupon and medication counseling for apixaban on 06/14/19  Lenis Noon, PharmD 06/14/19 6:51 PM

## 2019-06-14 NOTE — Progress Notes (Addendum)
PROGRESS NOTE    Alice Henry  JJH:[id number] DOB: 1986/03/22 DOA: 06/12/2019 PCP: No primary care provider on file.    Brief Narrative: 33 year old with no past medical history who presents complaining of shortness of breath, chest pain that is started 4 days prior to admission.  She was evaluated at urgent care for the above symptoms and she was prescribed muscle relaxant which did not help.  She presented to the ED again complaining of chest pain and shortness of breath a CT angios showed bilateral PE. In discussion with her mother, there is family history of blood clot, on uncle and aunt.   Assessment & Plan:   Principal Problem:   Acute pulmonary embolism (HCC) Active Problems:   Pulmonary emboli (HCC)  1-Bilateral PE small-to-moderate side bilateral PE by CT. She was a started on IV heparin, she will be transitioned to Eliquis today.  I discussed with patient risk and benefit of the medication. Okay to transition to Eliquis today, blood pressure and oxygen saturation are stable. Patient is a still experiencing shortness of breath and chest pain, would like to observe her on Eliquis today. Patient receiving Vicodin for pain. I will check partial work-up for hypercoagulable panel.  She will need protein S, protein C when she follows with her PCP. She will require at least 6 months of treatment.  She will benefit from following with a hematologist.  He has a family history of blood clot and she might require long-term anticoagulation. Echo and lower extremity Dopplers negative. Called by nurse this afternoon patient develops worsening chest pain, all sudden. Her vital were stable BP in the 130, Oxygen sat 100 %.  I came evaluated patient, pain was improving with Vicodin. She was not in distress, lungs clear. Vitals stable.  EKG sinus 92. Will check cardiac enzymes.  Monitor closely. PESI score low, level 1.  Discussed case with pulmonologist, will change back to heparin. Check  troponin, lactic acid, bnp.  2-vaginitis: Continue with Flagyl.  3-Morbid obesity: BMI of 38 I encourage patient to lose weight.  Estimated body mass index is 38.74 kg/m as calculated from the following:   Height as of this encounter: 5\' 6"  (1.676 m).   Weight as of this encounter: 108.9 kg.   DVT prophylaxis: Eliquis Code Status: Full code Family Communication: Mother updated over the phone Disposition Plan: Patient will remain in the hospital overnight to monitor on oral anticoagulation.  She is still short of breath and with chest pain.  Consultants:   None  Procedures:  Echo: Negative for right-sided heart strain Dopplers lower extremity negative for DVT  Antimicrobials:  Flagyl  Subjective: Patient still complaining of mild chest pain and shortness of breath, but overall she feels improved since admission.  Objective: Vitals:   06/13/19 0619 06/13/19 1246 06/13/19 2119 06/14/19 0516  BP: 117/77 120/77 136/85 114/72  Pulse: 85 88 85 95  Resp: 16 19 16    Temp: 98.2 F (36.8 C) 98 F (36.7 C) 98.6 F (37 C) 98.3 F (36.8 C)  TempSrc: Oral Oral Oral Oral  SpO2: 100% 100% 99% 100%  Weight:      Height:        Intake/Output Summary (Last 24 hours) at 06/14/2019 1258 Last data filed at 06/14/2019 0348 Gross per 24 hour  Intake 560.95 ml  Output 900 ml  Net -339.05 ml   Filed Weights   06/12/19 2311  Weight: 108.9 kg    Examination:  General exam: Appears calm and comfortable  Respiratory system: Clear to auscultation. Respiratory effort normal. Cardiovascular system: S1 & S2 heard, RRR. No JVD, murmurs, rubs, gallops or clicks. No pedal edema. Gastrointestinal system: Abdomen is nondistended, soft and nontender. No organomegaly or masses felt. Normal bowel sounds heard. Central nervous system: Alert and oriented. No focal neurological deficits. Extremities: Symmetric 5 x 5 power. Skin: No rashes, lesions or ulcers    Data Reviewed: I have  personally reviewed following labs and imaging studies  CBC: Recent Labs  Lab 06/12/19 2356 06/13/19 2241 06/14/19 0718  WBC 9.2 8.7 8.4  NEUTROABS 6.2  --   --   HGB 12.5 12.2 12.1  HCT 37.6 37.5 37.7  MCV 87.9 90.1 88.9  PLT 304 274 300   Basic Metabolic Panel: Recent Labs  Lab 06/12/19 2356  NA 136  K 3.8  CL 106  CO2 22  GLUCOSE 117*  BUN 14  CREATININE 0.73  CALCIUM 8.9   GFR: Estimated Creatinine Clearance: 127.2 mL/min (by C-G formula based on SCr of 0.73 mg/dL). Liver Function Tests: Recent Labs  Lab 06/12/19 2356  AST 18  ALT 14  ALKPHOS 77  BILITOT 0.2*  PROT 7.9  ALBUMIN 3.7   Recent Labs  Lab 06/12/19 2356  LIPASE 45   No results for input(s): AMMONIA in the last 168 hours. Coagulation Profile: No results for input(s): INR, PROTIME in the last 168 hours. Cardiac Enzymes: Recent Labs  Lab 06/12/19 2330  TROPONINI <0.03   BNP (last 3 results) No results for input(s): PROBNP in the last 8760 hours. HbA1C: No results for input(s): HGBA1C in the last 72 hours. CBG: No results for input(s): GLUCAP in the last 168 hours. Lipid Profile: No results for input(s): CHOL, HDL, LDLCALC, TRIG, CHOLHDL, LDLDIRECT in the last 72 hours. Thyroid Function Tests: No results for input(s): TSH, T4TOTAL, FREET4, T3FREE, THYROIDAB in the last 72 hours. Anemia Panel: No results for input(s): VITAMINB12, FOLATE, FERRITIN, TIBC, IRON, RETICCTPCT in the last 72 hours. Sepsis Labs: No results for input(s): PROCALCITON, LATICACIDVEN in the last 168 hours.  Recent Results (from the past 240 hour(s))  SARS Coronavirus 2 (Hosp order,Performed in [facility] lab via [employer] ID)     Status: None   Collection Time: 06/13/19 12:36 AM   Specimen: Dry Nasal Swab (A[employer]ott ID Now)  Result Value Ref Range Status   SARS Coronavirus 2 (Abbott ID Now) NEGATIVE NEGATIVE Final    Comment: (NOTE) Interpretive Result Comment(s): COVID 19 Positive SARS CoV 2 target nucleic acids  are DETECTED. The SARS CoV 2 RNA is generally detectable in upper and lower respiratory specimens during the acute phase of infection.  Positive results are indicative of active infection with SARS CoV 2.  Clinical correlation with patient history and other diagnostic information is necessary to determine patient infection status.  Positive results do not rule out bacterial infection or coinfection with other viruses. The expected result is Negative. COVID 19 Negative SARS CoV 2 target nucleic acids are NOT DETECTED. The SARS CoV 2 RNA is generally detectable in upper and lower respiratory specimens during the acute phase of infection.  Negative results do not preclude SARS CoV 2 infection, do not rule out coinfections with other pathogens, and should not be used as the sole basis for treatment or other patient management decisions.  Negative results must be combined with clinical  observations, patient history, and epidemiological information. The expected result is Negative. Invalid Presence or absence of SARS CoV 2 nucleic acids cannot be determined.  Repeat testing was performed on the submitted specimen and repeated Invalid results were obtained.  If clinically indicated, additional testing on a new specimen with an alternate test methodology [phone]([id number]) is advised.  The SARS CoV 2 RNA is generally detectable in upper and lower respiratory specimens during the acute phase of infection. The expected result is Negative. Fact Sheet for Patients:  http://www.graves-ford.org/https://www.fda.gov/media/136524/download Fact Sheet for Healthcare Providers: EnviroConcern.sihttps://www.fda.gov/media/136523/download This test is not yet approved or cleared by the [place] FDA and has been authorized for detection and/or diagnosis of SARS CoV 2 by FDA under an Emergency Use Authorization (EUA).  This EUA will remain in effect (meaning this test can be used) for the duration of the COVID19 d eclaration under Section 564(b)(1) of  the Act, 21 U.S.C. section 817 494 [id number] 3(b)(1), unless the authorization is terminated or revoked sooner. Performed at [facility] [place], [address] [place], [place] [address]   SARS Coronavirus 2 (CEPHEID - Performed in [facility]), Hosp Order     Status: None   Collection Time: 06/13/19  7:28 AM   Specimen: Nasopharyngeal Swab  Result Value Ref Range Status   SARS Coronavirus 2 NEGATIVE NEGATIVE Final    Comment: (NOTE) If result is NEGATIVE SARS-CoV-2 target nucleic acids are NOT DETECTED. The SARS-CoV-2 RNA is generally detectable in upper and lower  respiratory specimens during the acute phase of infection. The lowest  concentration of SARS-CoV-2 viral copies this assay can detect is 250  copies / mL. A negative result does not preclude SARS-CoV-2 infection  and should not be used as the sole basis for treatment or other  patient management decisions.  A negative result may occur with  improper specimen collection / handling, submission of specimen other  than nasopharyngeal swab, presence of viral mutation(s) within the  areas targeted by this assay, and inadequate number of viral copies  (<250 copies / mL). A negative result must be combined with clinical  observations, patient history, and epidemiological information. If result is POSITIVE SARS-CoV-2 target nucleic acids are DETECTED. The SARS-CoV-2 RNA is generally detectable in upper and lower  respiratory specimens dur ing the acute phase of infection.  Positive  results are indicative of active infection with SARS-CoV-2.  Clinical  correlation with patient history and other diagnostic information is  necessary to determine patient infection status.  Positive results do  not rule out bacterial infection or co-infection with other viruses. If result is PRESUMPTIVE POSTIVE SARS-CoV-2 nucleic acids MAY BE PRESENT.   A presumptive positive result was obtained on the submitted specimen  and confirmed on  repeat testing.  While 2019 novel coronavirus  (SARS-CoV-2) nucleic acids may be present in the submitted sample  additional confirmatory testing may be necessary for epidemiological  and / or clinical management purposes  to differentiate between  SARS-CoV-2 and other Sarbecovirus currently known to infect humans.  If clinically indicated additional testing with an alternate test  methodology [phone]([id number]) is advised. The SARS-CoV-2 RNA is generally  detectable in upper and lower respiratory sp ecimens during the acute  phase of infection. The expected result is Negative. Fact Sheet for Patients:  BoilerBrush.com.cyhttps://www.fda.gov/media/136312/download Fact Sheet for Healthcare Providers: https://pope.com/https://www.fda.gov/media/136313/download This test is not yet approved or cleared by the [place] FDA and has been authorized for detection and/or diagnosis of SARS-CoV-2 by FDA under an Emergency Use Authorization (EUA).  This EUA will remain in effect (meaning this test can be used) for the duration of the COVID-19 declaration under  Section 564(b)(1) of the Act, 21 U.S.C. section 360bbb-3(b)(1), unless the authorization is terminated or revoked sooner. Performed at [facility], [address] [place], [place] [address]          Radiology Studies: Dg Chest 2 View  Result Date: 06/12/2019 CLINICAL DATA:  Chest pain and shortness of breath. EXAM: CHEST - 2 VIEW COMPARISON:  02/20/2013 FINDINGS: Low lung volumes. Heart is normal in size. Normal mediastinal contours. Small left pleural effusion and basilar atelectasis/airspace disease. Mild peribronchial thickening. No pneumothorax. IMPRESSION: Small left pleural effusion and left basilar atelectasis/airspace disease. Electronically Signed   By: Narda RutherfordMelanie  Sanford M.D.   On: 06/12/2019 23:46   Ct Angio Chest Pe W And/or Wo Contrast  Result Date: 06/13/2019 CLINICAL DATA:  PE suspected, intermediate prob, positive D-dimer. Left chest pain.  EXAM: CT ANGIOGRAPHY CHEST WITH CONTRAST TECHNIQUE: Multidetector CT imaging of the chest was performed using the standard protocol during bolus administration of intravenous contrast. Multiplanar CT image reconstructions and MIPs were obtained to evaluate the vascular anatomy. CONTRAST:  75 cc OMNIPAQUE IOHEXOL 350 MG/ML SOLN COMPARISON:  Radiograph yesterday FINDINGS: Cardiovascular: Examination is positive for pulmonary emboli. Filling defects in the left lower lobar pulmonary artery extend into the segmental and subsegmental branches, greatest thromboembolic burden to the lateral basal left lower lobe. Small subsegmental filling defect also seen in the subsegmental right lower lobe pulmonary artery. Thromboembolic burden is small to moderate. No right heart strain, RV to LV ratio is 0.6. thoracic aorta is normal in caliber without dissection. No pericardial effusion. Mediastinum/Nodes: Enlarged mediastinal or hilar lymph nodes. No visualized thyroid nodule. Esophagus slightly patulous. No esophageal wall thickening. Lungs/Pleura: Small left pleural effusion. Streaky and subpleural left lower lobe opacities likely combination of pulmonary infarct and atelectasis. Right lung is clear. Trachea and mainstem bronchi are patent. Upper Abdomen: No acute abnormality. Musculoskeletal: There are no acute or suspicious osseous abnormalities. Review of the MIP images confirms the above findings. IMPRESSION: 1. Positive for bilateral pulmonary emboli, thromboembolic burden is small to moderate. No right heart strain. 2. Left lower lobe opacities likely combination of pulmonary infarct and atelectasis. Small left pleural effusion. Critical Value/emergent results were called by telephone at the time of interpretation on 06/13/2019 at 2:18 am to Dr. Rochele RaringKRISTEN WARD , who verbally acknowledged these results. Electronically Signed   By: Narda RutherfordMelanie  Sanford M.D.   On: 06/13/2019 02:18   Vas [place] Lower Extremity Venous (dvt)  Result  Date: 06/14/2019  Lower Venous Study Indications: Edema.  Limitations: Body habitus. Performing Technologist: Jeb LeveringJill Parker RDMS, RVT  Examination Guidelines: A complete evaluation includes B-mode imaging, spectral Doppler, color Doppler, and power Doppler as needed of all accessible portions of each vessel. Bilateral testing is considered an integral part of a complete examination. Limited examinations for reoccurring indications may be performed as noted.  +---------+---------------+---------+-----------+----------+-------+  RIGHT     Compressibility Phasicity Spontaneity Properties Summary  +---------+---------------+---------+-----------+----------+-------+  CFV       Full            Yes       Yes                             +---------+---------------+---------+-----------+----------+-------+  SFJ       Full                                                      +---------+---------------+---------+-----------+----------+-------+  FV Prox   Full                                                      +---------+---------------+---------+-----------+----------+-------+  FV Mid    Full                                                      +---------+---------------+---------+-----------+----------+-------+  FV Distal Full                                                      +---------+---------------+---------+-----------+----------+-------+  PFV       Full                                                      +---------+---------------+---------+-----------+----------+-------+  POP       Full            Yes       Yes                             +---------+---------------+---------+-----------+----------+-------+  PTV       Full                                                      +---------+---------------+---------+-----------+----------+-------+  PERO      Full                                                      +---------+---------------+---------+-----------+----------+-------+   Right Technical Findings: No all  segments well visualized  +---------+---------------+---------+-----------+----------+-------+  LEFT      Compressibility Phasicity Spontaneity Properties Summary  +---------+---------------+---------+-----------+----------+-------+  CFV       Full            Yes       Yes                             +---------+---------------+---------+-----------+----------+-------+  SFJ       Full                                                      +---------+---------------+---------+-----------+----------+-------+  FV Prox   Full                                                      +---------+---------------+---------+-----------+----------+-------+  FV Mid    Full                                                      +---------+---------------+---------+-----------+----------+-------+  FV Distal Full                                                      +---------+---------------+---------+-----------+----------+-------+  PFV       Full                                                      +---------+---------------+---------+-----------+----------+-------+  POP       Full            Yes       Yes                             +---------+---------------+---------+-----------+----------+-------+  PTV       Full                                                      +---------+---------------+---------+-----------+----------+-------+  PERO      Full                                                      +---------+---------------+---------+-----------+----------+-------+   Left Technical Findings: No all segments well visualized   Summary: Right: There is no evidence of deep vein thrombosis in the lower extremity. However, portions of this examination were limited- see technologist comments above. No cystic structure found in the popliteal fossa. Left: There is no evidence of deep vein thrombosis in the lower extremity. However, portions of this examination were limited- see technologist comments above. No cystic structure found in the  popliteal fossa.  *See table(s) above for measurements and observations.    Preliminary         Scheduled Meds:  apixaban  10 mg Oral BID   Followed by   Melene Muller ON 06/21/2019] apixaban  5 mg Oral BID   metroNIDAZOLE  500 mg Oral BID   polyethylene glycol  17 g Oral Daily   senna-docusate  1 tablet Oral BID   Continuous Infusions:   LOS: 0 days    Time spent: 35 minutes.     Alba Cory, MD [facility] Pager [phone]  If 7PM-7AM, please contact night-coverage [email or web address] Password [facility] 06/14/2019, 12:58 PM

## 2019-06-14 NOTE — Progress Notes (Signed)
ANTICOAGULATION CONSULT NOTE  Pharmacy Consult:  Heparin Indication: pulmonary embolus  No Known Allergies  Patient Measurements: Height: 5\' 6"  (167.6 cm) Weight: 240 lb (108.9 kg) IBW/kg (Calculated) : 59.3 Heparin Dosing Weight: 85 kg  Vital Signs: Temp: 98.6 F (37 C) (06/18 2119) Temp Source: Oral (06/18 2119) BP: 136/85 (06/18 2119) Pulse Rate: 85 (06/18 2119)  Labs: Recent Labs    06/12/19 2330 06/12/19 2356 06/13/19 0921 06/13/19 1628 06/13/19 2241  HGB  --  12.5  --   --  12.2  HCT  --  37.6  --   --  37.5  PLT  --  304  --   --  274  HEPARINUNFRC  --   --  0.40 0.30 0.21*  CREATININE  --  0.73  --   --   --   TROPONINI <0.03  --   --   --   --     Estimated Creatinine Clearance: 127.2 mL/min (by C-G formula based on SCr of 0.73 mg/dL).   Assessment: 72 YOF presented with neck and shoulder pain that cause chest tightness.  CTA showed acute bilateral PEs and Pharmacy consulted to dose IV heparin.  Baseline labs and PMH reviewed.  06/14/2019:  Heparin level is below goal at 0.21 after rate increased to 1300 units/hr  CBC: WNL  No bleeding or infusion related issues reported by RN. Confirmed heparin infusing at correct rate and no interruptions in infusion.  Goal of Therapy:  Heparin level 0.3-0.7 units/ml Monitor platelets by anticoagulation protocol: Yes   Plan:   Give 2000 unit bolus and increase heparin drip to 1500 units/hr  Recheck HL and CBC in 6 hours  Daily heparin level and CBC while on heparin  Noted plans to transition to [place] hopefully 6/19  Eudelia Bunch, Pharm.D 06/14/2019 12:43 AM

## 2019-06-15 LAB — BASIC METABOLIC PANEL
Anion gap: 9 (ref 5–15)
BUN: 9 mg/dL (ref 6–20)
CO2: 22 mmol/L (ref 22–32)
Calcium: 8.7 mg/dL — ABNORMAL LOW (ref 8.9–10.3)
Chloride: 104 mmol/L (ref 98–111)
Creatinine, Ser: 0.67 mg/dL (ref 0.44–1.00)
GFR calc Af Amer: >60 mL/min (ref >60)
GFR calc non Af Amer: >60 mL/min (ref >60)
Glucose, Bld: 89 mg/dL (ref 70–99)
Potassium: 4 mmol/L (ref 3.5–5.1)
Sodium: 135 mmol/L (ref 135–145)

## 2019-06-15 LAB — CBC
HCT: 37.9 % (ref 36.0–46.0)
Hemoglobin: 12.5 g/dL (ref 12.0–15.0)
MCH: 29.1 pg (ref 26.0–34.0)
MCHC: 33 g/dL (ref 30.0–36.0)
MCV: 88.1 fL (ref 80.0–100.0)
Platelets: 304 10*3/uL (ref 150–400)
RBC: 4.3 MIL/uL (ref 3.87–5.11)
RDW: 13.5 % (ref 11.5–15.5)
WBC: 8.7 10*3/uL (ref 4.0–10.5)
nRBC: 0 % (ref 0.0–0.2)

## 2019-06-15 LAB — HEPARIN LEVEL (UNFRACTIONATED)
Heparin Unfractionated: 1.04 IU/mL — ABNORMAL HIGH (ref 0.30–0.70)
Heparin Unfractionated: 0.71 IU/mL — ABNORMAL HIGH (ref 0.30–0.70)

## 2019-06-15 LAB — APTT
aPTT: 80 seconds — ABNORMAL HIGH (ref 24–36)
aPTT: 81 seconds — ABNORMAL HIGH (ref 24–36)

## 2019-06-15 LAB — LACTIC ACID, PLASMA: Lactic Acid, Venous: 0.8 mmol/L (ref 0.5–1.9)

## 2019-06-15 LAB — TROPONIN I: Troponin I: <0.03 ng/mL (ref <0.03)

## 2019-06-15 MED ORDER — POLYETHYLENE GLYCOL 3350 17 G PO PACK
17.0000 g | PACK | Freq: Two times a day (BID) | ORAL | Status: DC
  Administered 2019-06-15 – 2019-06-16 (×3): 17 g via ORAL
  Filled 2019-06-15 (×3): qty 1

## 2019-06-15 NOTE — Progress Notes (Signed)
ANTICOAGULATION CONSULT NOTE  Pharmacy Consult:  Heparin Indication: pulmonary embolus  No Known Allergies  Patient Measurements: Height: 5\' 6"  (167.6 cm) Weight: 240 lb (108.9 kg) IBW/kg (Calculated) : 59.3 Heparin Dosing Weight: 85 kg  Vital Signs: Temp: 98.8 F (37.1 C) (06/20 0518) Temp Source: Oral (06/20 0518) BP: 119/74 (06/20 0518) Pulse Rate: 93 (06/20 0518)  Labs: Recent Labs    06/12/19 2356  06/13/19 2241 06/14/19 0718 06/14/19 1726 06/14/19 2247 06/15/19 0709  HGB 12.5  --  12.2 12.1  --   --  12.5  HCT 37.6  --  37.5 37.7  --   --  37.9  PLT 304  --  274 300  --   --  304  APTT  --   --   --   --   --   --  80*  HEPARINUNFRC  --    < > 0.21* 0.60  --   --  1.04*  CREATININE 0.73  --   --   --   --   --  0.67  TROPONINI  --   --   --   --  <0.03 <0.03 <0.03   < > = values in this interval not displayed.    Estimated Creatinine Clearance: 127.2 mL/min (by C-G formula based on SCr of 0.67 mg/dL).   Medical History: Past Medical History:  Diagnosis Date  . Allergy     Assessment: 4 YOF presented with neck and shoulder pain that cause chest tightness.  CTA showed acute bilateral PEs and Pharmacy was consulted to dose IV heparin.  Baseline labs and PMH reviewed.  Patient was converted from IV heparin to apixaban 6/19, but then converted back to IV heparin after experiencing chest pain . Received apixaban 10 mg x 1 on 6/19 @1310   06/15/2019:  APTT therapeutic on heparin 1500 units/hr  Anti-Xa level is supratherapeutic but this is likely artifact secondary to yesterday PM's apixaban dose  H/H and Pltc WNL  No bleeding or infusion interruptions per RN report   Goal of Therapy:  aPTT 66 - 102 sec  Heparin level 0.3 - 0.7 after apixaban effect dissipates and results of aPTT and HL correlate Monitor platelets by anticoagulation protocol: Yes   Plan:   Continue heparin at 1500 units/hr  Recheck aPTT and heparin level at 6pm, then daily while  on heparin infusion  CBC daily while on heparin infusion  Patient was provided with manufacturer coupon and medication counseling for apixaban on 06/14/19  Clayburn Pert, PharmD, BCPS [phone] 06/15/2019  9:56 AM

## 2019-06-15 NOTE — Progress Notes (Signed)
PROGRESS NOTE    Alice Henry  KCL:[id number] DOB: 04-23-1986 DOA: 06/12/2019 PCP: No primary care provider on file.    Brief Narrative: 33 year old with no past medical history who presents complaining of shortness of breath, chest pain that is started 4 days prior to admission.  She was evaluated at urgent care for the above symptoms and she was prescribed muscle relaxant which did not help.  She presented to the ED again complaining of chest pain and shortness of breath a CT angios showed bilateral PE. In discussion with her mother, there is family history of blood clot, on uncle and aunt.   Assessment & Plan:   Principal Problem:   Acute pulmonary embolism (HCC) Active Problems:   Pulmonary emboli (HCC)  1-Bilateral PE -Small-to-moderate side bilateral PE by CT. -Patient receiving Vicodin for pain. -partial work-up for hypercoagulable panel (pending)  She will need protein S, protein C when she follows with her PCP.  -She will require at least 6 months of treatment.  She will benefit from following with a hematologist.  she has a family history of blood clot and she might require long-term anticoagulation. -Echo and lower extremity Dopplers negative. -The afternoon of 6-19 patient develops worsening chest pain, suddenly. Her vital were stable BP in the 130, Oxygen sat 100 %. I  evaluated patient, pain was improving with Vicodin. She was not in distress, lungs clear. Vitals stable. PESI score low, level 1. Discussed case with pulmonologist, Eliquis was   back to heparin. Troponin and lactic acid normal.  Will continue with heparin for today, transition to Eliquis tomorrow if stable.   2-vaginitis: Continue with Flagyl.  3-Morbid obesity: BMI of 38 I encourage patient to lose weight.  Estimated body mass index is 38.74 kg/m as calculated from the following:   Height as of this encounter: 5\' 6"  (1.676 m).   Weight as of this encounter: 108.9 kg.   DVT prophylaxis: Eliquis Code  Status: Full code Family Communication: Mother updated over the phone Disposition Plan: Patient will remain in the hospital for IV heparin.   Consultants:   None  Procedures:  Echo: Negative for right-sided heart strain Dopplers lower extremity negative for DVT  Antimicrobials:  Flagyl  Subjective: Continue to have chest pain but not as bad as yesterday. She is feeling a little better today.   Objective: Vitals:   06/14/19 1642 06/14/19 1806 06/14/19 2113 06/15/19 0518  BP: 132/85 122/73 123/70 119/74  Pulse: 97 86 84 93  Resp: (!) 24 18 17 17   Temp: 98.2 F (36.8 C) (!) 97.2 F (36.2 C) 98.2 F (36.8 C) 98.8 F (37.1 C)  TempSrc: Oral Oral Oral Oral  SpO2: 100% 99% 100% 100%  Weight:      Height:        Intake/Output Summary (Last 24 hours) at 06/15/2019 0831 Last data filed at 06/15/2019 0600 Gross per 24 hour  Intake 331.12 ml  Output 0 ml  Net 331.12 ml   Filed Weights   06/12/19 2311  Weight: 108.9 kg    Examination:  General exam: NAD Respiratory system: CTA Cardiovascular system: S 1, S 2 RRR Gastrointestinal system: BS present, soft, nt Central nervous system: non focal.  Extremities: symmetric power.  Skin: no rashes  Data Reviewed: I have personally reviewed following labs and imaging studies  CBC: Recent Labs  Lab 06/12/19 2356 06/13/19 2241 06/14/19 0718  WBC 9.2 8.7 8.4  NEUTROABS 6.2  --   --   HGB 12.5 12.2  12.1  HCT 37.6 37.5 37.7  MCV 87.9 90.1 88.9  PLT 304 274 300   Basic Metabolic Panel: Recent Labs  Lab 06/12/19 2356  NA 136  K 3.8  CL 106  CO2 22  GLUCOSE 117*  BUN 14  CREATININE 0.73  CALCIUM 8.9   GFR: Estimated Creatinine Clearance: 127.2 mL/min (by C-G formula based on SCr of 0.73 mg/dL). Liver Function Tests: Recent Labs  Lab 06/12/19 2356  AST 18  ALT 14  ALKPHOS 77  BILITOT 0.2*  PROT 7.9  ALBUMIN 3.7   Recent Labs  Lab 06/12/19 2356  LIPASE 45   No results for input(s): AMMONIA in the  last 168 hours. Coagulation Profile: No results for input(s): INR, PROTIME in the last 168 hours. Cardiac Enzymes: Recent Labs  Lab 06/12/19 2330 06/14/19 1726 06/14/19 2247  TROPONINI <0.03 <0.03 <0.03   BNP (last 3 results) No results for input(s): PROBNP in the last 8760 hours. HbA1C: No results for input(s): HGBA1C in the last 72 hours. CBG: No results for input(s): GLUCAP in the last 168 hours. Lipid Profile: No results for input(s): CHOL, HDL, LDLCALC, TRIG, CHOLHDL, LDLDIRECT in the last 72 hours. Thyroid Function Tests: No results for input(s): TSH, T4TOTAL, FREET4, T3FREE, THYROIDAB in the last 72 hours. Anemia Panel: No results for input(s): VITAMINB12, FOLATE, FERRITIN, TIBC, IRON, RETICCTPCT in the last 72 hours. Sepsis Labs: Recent Labs  Lab 06/14/19 1801 06/14/19 2247  LATICACIDVEN 0.6 0.8    Recent Results (from the past 240 hour(s))  SARS Coronavirus 2 (Hosp order,Performed in [facility] lab via [employer] ID)     Status: None   Collection Time: 06/13/19 12:36 AM   Specimen: Dry Nasal Swab ([employer] ID Now)  Result Value Ref Range Status   SARS Coronavirus 2 ([employer] ID Now) NEGATIVE NEGATIVE Final    Comment: (NOTE) Interpretive Result Comment(s): COVID 19 Positive SARS CoV 2 target nucleic acids are DETECTED. The SARS CoV 2 RNA is generally detectable in upper and lower respiratory specimens during the acute phase of infection.  Positive results are indicative of active infection with SARS CoV 2.  Clinical correlation with patient history and other diagnostic information is necessary to determine patient infection status.  Positive results do not rule out bacterial infection or coinfection with other viruses. The expected result is Negative. COVID 19 Negative SARS CoV 2 target nucleic acids are NOT DETECTED. The SARS CoV 2 RNA is generally detectable in upper and lower respiratory specimens during the acute phase of infection.  Negative results do not  preclude SARS CoV 2 infection, do not rule out coinfections with other pathogens, and should not be used as the sole basis for treatment or other patient management decisions.  Negative results must be combined with clinical  observations, patient history, and epidemiological information. The expected result is Negative. Invalid Presence or absence of SARS CoV 2 nucleic acids cannot be determined. Repeat testing was performed on the submitted specimen and repeated Invalid results were obtained.  If clinically indicated, additional testing on a new specimen with an alternate test methodology [phone]) is advised.  The SARS CoV 2 RNA is generally detectable in upper and lower respiratory specimens during the acute phase of infection. The expected result is Negative. Fact Sheet for Patients:  http://www.graves-ford.org/ Fact Sheet for Healthcare Providers: EnviroConcern.si This test is not yet approved or cleared by the [place] FDA and has been authorized for detection and/or diagnosis of SARS CoV 2 by FDA under an  Emergency Use Authorization (EUA).  This EUA will remain in effect (meaning this test can be used) for the duration of the COVID19 d eclaration under Section 564(b)(1) of the Act, 21 U.S.C. section (619) [id number] 3(b)(1), unless the authorization is terminated or revoked sooner. Performed at [facility] [place], [address] [place], [place] [address]   SARS Coronavirus 2 (CEPHEID - Performed in [facility] hospital lab), Hosp Order     Status: None   Collection Time: 06/13/19  7:28 AM   Specimen: Nasopharyngeal Swab  Result Value Ref Range Status   SARS Coronavirus 2 NEGATIVE NEGATIVE Final    Comment: (NOTE) If result is NEGATIVE SARS-CoV-2 target nucleic acids are NOT DETECTED. The SARS-CoV-2 RNA is generally detectable in upper and lower  respiratory specimens during the acute phase of infection. The lowest    concentration of SARS-CoV-2 viral copies this assay can detect is 250  copies / mL. A negative result does not preclude SARS-CoV-2 infection  and should not be used as the sole basis for treatment or other  patient management decisions.  A negative result may occur with  improper specimen collection / handling, submission of specimen other  than nasopharyngeal swab, presence of viral mutation(s) within the  areas targeted by this assay, and inadequate number of viral copies  (<250 copies / mL). A negative result must be combined with clinical  observations, patient history, and epidemiological information. If result is POSITIVE SARS-CoV-2 target nucleic acids are DETECTED. The SARS-CoV-2 RNA is generally detectable in upper and lower  respiratory specimens dur ing the acute phase of infection.  Positive  results are indicative of active infection with SARS-CoV-2.  Clinical  correlation with patient history and other diagnostic information is  necessary to determine patient infection status.  Positive results do  not rule out bacterial infection or co-infection with other viruses. If result is PRESUMPTIVE POSTIVE SARS-CoV-2 nucleic acids MAY BE PRESENT.   A presumptive positive result was obtained on the submitted specimen  and confirmed on repeat testing.  While 2019 novel coronavirus  (SARS-CoV-2) nucleic acids may be present in the submitted sample  additional confirmatory testing may be necessary for epidemiological  and / or clinical management purposes  to differentiate between  SARS-CoV-2 and other Sarbecovirus currently known to infect humans.  If clinically indicated additional testing with an alternate test  methodology [phone]([id number]) is advised. The SARS-CoV-2 RNA is generally  detectable in upper and lower respiratory sp ecimens during the acute  phase of infection. The expected result is Negative. Fact Sheet for Patients:  BoilerBrush.com.cyhttps://www.fda.gov/media/136312/download Fact Sheet  for Healthcare Providers: https://pope.com/https://www.fda.gov/media/136313/download This test is not yet approved or cleared by the [place] FDA and has been authorized for detection and/or diagnosis of SARS-CoV-2 by FDA under an Emergency Use Authorization (EUA).  This EUA will remain in effect (meaning this test can be used) for the duration of the COVID-19 declaration under Section 564(b)(1) of the Act, 21 U.S.C. section 360bbb-3(b)(1), unless the authorization is terminated or revoked sooner. Performed at [facility], [address] [place], [place] [address]          Radiology Studies: Vas Koreas Lower Extremity Venous (dvt)  Result Date: 06/14/2019  Lower Venous Study Indications: Edema.  Limitations: Body habitus. Comparison Study: no prior Performing Technologist: Jeb LeveringJill Parker RDMS, RVT  Examination Guidelines: A complete evaluation includes B-mode imaging, spectral Doppler, color Doppler, and power Doppler as needed of all accessible portions of each vessel. Bilateral testing  is considered an integral part of a complete examination. Limited examinations for reoccurring indications may be performed as noted.  +---------+---------------+---------+-----------+----------+-------+  RIGHT     Compressibility Phasicity Spontaneity Properties Summary  +---------+---------------+---------+-----------+----------+-------+  CFV       Full            Yes       Yes                             +---------+---------------+---------+-----------+----------+-------+  SFJ       Full                                                      +---------+---------------+---------+-----------+----------+-------+  FV Prox   Full                                                      +---------+---------------+---------+-----------+----------+-------+  FV Mid    Full                                                      +---------+---------------+---------+-----------+----------+-------+  FV Distal Full                                                       +---------+---------------+---------+-----------+----------+-------+  PFV       Full                                                      +---------+---------------+---------+-----------+----------+-------+  POP       Full            Yes       Yes                             +---------+---------------+---------+-----------+----------+-------+  PTV       Full                                                      +---------+---------------+---------+-----------+----------+-------+  PERO      Full                                                      +---------+---------------+---------+-----------+----------+-------+   Right Technical Findings: No all segments well visualized  +---------+---------------+---------+-----------+----------+-------+  LEFT      Compressibility Phasicity  Spontaneity Properties Summary  +---------+---------------+---------+-----------+----------+-------+  CFV       Full            Yes       Yes                             +---------+---------------+---------+-----------+----------+-------+  SFJ       Full                                                      +---------+---------------+---------+-----------+----------+-------+  FV Prox   Full                                                      +---------+---------------+---------+-----------+----------+-------+  FV Mid    Full                                                      +---------+---------------+---------+-----------+----------+-------+  FV Distal Full                                                      +---------+---------------+---------+-----------+----------+-------+  PFV       Full                                                      +---------+---------------+---------+-----------+----------+-------+  POP       Full            Yes       Yes                             +---------+---------------+---------+-----------+----------+-------+  PTV       Full                                                       +---------+---------------+---------+-----------+----------+-------+  PERO      Full                                                      +---------+---------------+---------+-----------+----------+-------+   Left Technical Findings: No all segments well visualized   Summary: Right: There is no evidence of deep vein thrombosis in the lower extremity. However, portions of this examination were limited- see technologist comments above. No cystic structure found in the popliteal fossa. Left: There is no evidence  of deep vein thrombosis in the lower extremity. However, portions of this examination were limited- see technologist comments above. No cystic structure found in the popliteal fossa.  *See table(s) above for measurements and observations. Electronically signed by Sherald Hess MD on 06/14/2019 at 4:58:00 PM.    Final         Scheduled Meds:  metroNIDAZOLE  500 mg Oral BID   polyethylene glycol  17 g Oral Daily   senna-docusate  1 tablet Oral BID   Continuous Infusions:  heparin 1,500 Units/hr (06/15/19 0115)     LOS: 1 day    Time spent: 35 minutes.     Alba Cory, MD [facility] Pager [phone]  If 7PM-7AM, please contact night-coverage [email or web address] Password [facility] 06/15/2019, 8:31 AM

## 2019-06-15 NOTE — Progress Notes (Signed)
ANTICOAGULATION CONSULT NOTE  Pharmacy Consult:  Heparin Indication: pulmonary embolus  No Known Allergies  Patient Measurements: Height: 5\' 6"  (167.6 cm) Weight: 240 lb (108.9 kg) IBW/kg (Calculated) : 59.3 Heparin Dosing Weight: 85 kg  Vital Signs: Temp: 98.3 F (36.8 C) (06/20 1411) Temp Source: Oral (06/20 1411) BP: 120/81 (06/20 1411) Pulse Rate: 90 (06/20 1411)  Labs: Recent Labs    06/12/19 2356  06/13/19 2241 06/14/19 0718 06/14/19 1726 06/14/19 2247 06/15/19 0709 06/15/19 1835  HGB 12.5  --  12.2 12.1  --   --  12.5  --   HCT 37.6  --  37.5 37.7  --   --  37.9  --   PLT 304  --  274 300  --   --  304  --   APTT  --   --   --   --   --   --  80* 81*  HEPARINUNFRC  --    < > 0.21* 0.60  --   --  1.04* 0.71*  CREATININE 0.73  --   --   --   --   --  0.67  --   TROPONINI  --   --   --   --  <0.03 <0.03 <0.03  --    < > = values in this interval not displayed.    Estimated Creatinine Clearance: 127.2 mL/min (by C-G formula based on SCr of 0.67 mg/dL).   Medical History: Past Medical History:  Diagnosis Date  . Allergy     Assessment: 25 YOF presented with neck and shoulder pain that cause chest tightness.  CTA showed acute bilateral PEs and Pharmacy was consulted to dose IV heparin.  Baseline labs and PMH reviewed.  Patient was converted from IV heparin to apixaban 6/19, but then converted back to IV heparin after experiencing chest pain . Received apixaban 10 mg x 1 on 6/19 @1310   06/15/2019:  APTT therapeutic on heparin 1500 units/hr  Anti-Xa level is supratherapeutic but this is likely artifact secondary to yesterday PM's apixaban dose  H/H and Pltc WNL  No bleeding or infusion interruptions per RN report  2nd shift update:  Repeat aPTT therapeutic with heparin infusing @ 1500 units/hr  No complications of therapy noted  Goal of Therapy:  aPTT 66 - 102 sec  Heparin level 0.3 - 0.7 after apixaban effect dissipates and results of aPTT and  HL correlate Monitor platelets by anticoagulation protocol: Yes   Plan:   Continue heparin at 1500 units/hr  Check aPTT with AM labs (continue monitoring aPTT levels until aPTT and HL correlate)  Heparin level and CBC daily while on heparin infusion  Patient was provided with manufacturer coupon and medication counseling for apixaban on 06/14/19  Leone Haven, PharmD 06/15/2019  8:26 PM

## 2019-06-16 LAB — URINALYSIS, ROUTINE W REFLEX MICROSCOPIC
Bilirubin Urine: NEGATIVE
Glucose, UA: NEGATIVE mg/dL
Ketones, ur: 5 mg/dL — AB
Nitrite: NEGATIVE
Protein, ur: NEGATIVE mg/dL
Specific Gravity, Urine: 1.009 (ref 1.005–1.030)
pH: 6 (ref 5.0–8.0)

## 2019-06-16 LAB — CBC
HCT: 40 % (ref 36.0–46.0)
Hemoglobin: 13.3 g/dL (ref 12.0–15.0)
MCH: 29.2 pg (ref 26.0–34.0)
MCHC: 33.3 g/dL (ref 30.0–36.0)
MCV: 87.7 fL (ref 80.0–100.0)
Platelets: 334 10*3/uL (ref 150–400)
RBC: 4.56 MIL/uL (ref 3.87–5.11)
RDW: 13.5 % (ref 11.5–15.5)
WBC: 10.2 10*3/uL (ref 4.0–10.5)
nRBC: 0 % (ref 0.0–0.2)

## 2019-06-16 LAB — APTT: aPTT: 95 seconds — ABNORMAL HIGH (ref 24–36)

## 2019-06-16 LAB — CARDIOLIPIN ANTIBODIES, IGG, IGM, IGA
Anticardiolipin IgA: <9 APL U/mL (ref 0–11)
Anticardiolipin IgG: <9 GPL U/mL (ref 0–14)
Anticardiolipin IgM: <9 MPL U/mL (ref 0–12)

## 2019-06-16 LAB — BETA-2-GLYCOPROTEIN I ABS, IGG/M/A
Beta-2 Glyco I IgG: <9 GPI IgG units (ref 0–20)
Beta-2-Glycoprotein I IgA: <9 GPI IgA units (ref 0–25)
Beta-2-Glycoprotein I IgM: <9 GPI IgM units (ref 0–32)

## 2019-06-16 LAB — HEPARIN LEVEL (UNFRACTIONATED): Heparin Unfractionated: 0.64 IU/mL (ref 0.30–0.70)

## 2019-06-16 MED ORDER — CEPHALEXIN 500 MG PO CAPS: 500.0000 mg | ORAL_CAPSULE | Freq: Two times a day (BID) | ORAL | 0 refills | Status: DC

## 2019-06-16 MED ORDER — POLYETHYLENE GLYCOL 3350 17 G PO PACK: 17.0000 g | PACK | Freq: Two times a day (BID) | ORAL | 0 refills | Status: AC

## 2019-06-16 MED ORDER — SENNOSIDES-DOCUSATE SODIUM 8.6-50 MG PO TABS: 1.0000 | ORAL_TABLET | Freq: Two times a day (BID) | ORAL | 0 refills | Status: AC

## 2019-06-16 MED ORDER — HYDROCODONE-ACETAMINOPHEN 5-325 MG PO TABS: 2.0000 | ORAL_TABLET | Freq: Four times a day (QID) | ORAL | 0 refills | Status: AC | PRN

## 2019-06-16 MED ORDER — BISACODYL 5 MG PO TBEC: 5.0000 mg | DELAYED_RELEASE_TABLET | Freq: Every day | ORAL | Status: DC | PRN

## 2019-06-16 MED ORDER — LACTULOSE 10 GM/15ML PO SOLN
20.0000 g | Freq: Once | ORAL | Status: AC
  Administered 2019-06-16: 20 g via ORAL
  Filled 2019-06-16: qty 30

## 2019-06-16 MED ORDER — APIXABAN 5 MG PO TABS
10.0000 mg | ORAL_TABLET | Freq: Two times a day (BID) | ORAL | Status: DC
  Administered 2019-06-16: 10 mg via ORAL
  Filled 2019-06-16: qty 2

## 2019-06-16 MED ORDER — ELIQUIS 5 MG VTE STARTER PACK: ORAL_TABLET | ORAL | 0 refills | Status: AC

## 2019-06-16 MED ORDER — BISACODYL 5 MG PO TBEC: 5.0000 mg | DELAYED_RELEASE_TABLET | Freq: Once | ORAL | Status: DC

## 2019-06-16 MED ORDER — CEPHALEXIN 500 MG PO CAPS
500.0000 mg | ORAL_CAPSULE | Freq: Two times a day (BID) | ORAL | Status: DC
  Administered 2019-06-16: 500 mg via ORAL
  Filled 2019-06-16: qty 1

## 2019-06-16 NOTE — Progress Notes (Signed)
AVS given to patient and explained at the bedside. Medications and follow up appointments have been explained with pt verbalizing understanding.  

## 2019-06-16 NOTE — Progress Notes (Signed)
ANTICOAGULATION CONSULT NOTE  Pharmacy Consult:  Heparin Indication: pulmonary embolus  No Known Allergies  Patient Measurements: Height: 5\' 6"  (167.6 cm) Weight: 240 lb (108.9 kg) IBW/kg (Calculated) : 59.3 Heparin Dosing Weight: 85 kg  Vital Signs: Temp: 98.7 F (37.1 C) (06/21 0550) Temp Source: Oral (06/21 0550) BP: 105/78 (06/21 0550) Pulse Rate: 92 (06/21 0550)  Labs: Recent Labs    06/14/19 0718 06/14/19 1726 06/14/19 2247 06/15/19 0709 06/15/19 1835 06/16/19 0416  HGB 12.1  --   --  12.5  --  13.3  HCT 37.7  --   --  37.9  --  40.0  PLT 300  --   --  304  --  334  APTT  --   --   --  80* 81* 95*  HEPARINUNFRC 0.60  --   --  1.04* 0.71* 0.64  CREATININE  --   --   --  0.67  --   --   TROPONINI  --  <0.03 <0.03 <0.03  --   --     Estimated Creatinine Clearance: 127.2 mL/min (by C-G formula based on SCr of 0.67 mg/dL).   Medical History: Past Medical History:  Diagnosis Date  . Allergy     Assessment: 83 YOF presented with neck and shoulder pain that cause chest tightness.  CTA showed acute bilateral PEs and Pharmacy was consulted to dose IV heparin.  Baseline labs and PMH reviewed.  Patient was converted from IV heparin to apixaban 6/19, but then converted back to IV heparin after experiencing chest pain . Received apixaban 10 mg x 1 on 6/19 @1310   Today, 06/16/2019:  APTT therapeutic (95) and anti-Xa heparin level therapeutic (0.64) on heparin 1500 units/hr  H/H and Pltc WNL  No bleeding or infusion interruptions per RN report  Goal of Therapy:  aPTT 66 - 102 sec  Heparin level 0.3 - 0.7 after apixaban effect dissipates and results of aPTT and HL correlate Monitor platelets by anticoagulation protocol: Yes   Plan:   Continue heparin at 1500 units/hr for now  Await MD rounds for assessment of potential transition back to apixaban today.   Patient was provided with manufacturer coupon and medication counseling for apixaban on  06/14/19   Clayburn Pert, PharmD, BCPS [phone] 06/16/2019  6:51 AM

## 2019-06-16 NOTE — Progress Notes (Signed)
SATURATION QUALIFICATIONS: (This note is used to comply with regulatory documentation for home oxygen)  Patient Saturations on Room Air at Rest = 98%  Patient Saturations on Room Air while Ambulating = 99%  Patient Saturations on 0 Liters of oxygen while Ambulating = 99%  Please briefly explain why patient needs home oxygen: No O2 needed

## 2019-06-16 NOTE — Progress Notes (Signed)
ANTICOAGULATION CONSULT NOTE  Pharmacy Consult:  Apixaban Indication: pulmonary embolus  No Known Allergies  Patient Measurements: Height: 5\' 6"  (167.6 cm) Weight: 240 lb (108.9 kg) IBW/kg (Calculated) : 59.3 Heparin Dosing Weight: 85 kg  Vital Signs: Temp: 98.7 F (37.1 C) (06/21 0550) Temp Source: Oral (06/21 0550) BP: 105/78 (06/21 0550) Pulse Rate: 92 (06/21 0550)  Labs: Recent Labs    06/14/19 0718 06/14/19 1726 06/14/19 2247 06/15/19 0709 06/15/19 1835 06/16/19 0416  HGB 12.1  --   --  12.5  --  13.3  HCT 37.7  --   --  37.9  --  40.0  PLT 300  --   --  304  --  334  APTT  --   --   --  80* 81* 95*  HEPARINUNFRC 0.60  --   --  1.04* 0.71* 0.64  CREATININE  --   --   --  0.67  --   --   TROPONINI  --  <0.03 <0.03 <0.03  --   --     Estimated Creatinine Clearance: 127.2 mL/min (by C-G formula based on SCr of 0.67 mg/dL).   Medical History: Past Medical History:  Diagnosis Date  . Allergy     Assessment: 24 YOF presented with neck and shoulder pain that cause chest tightness.  CTA showed acute bilateral PEs and Pharmacy was consulted to dose IV heparin.  Baseline labs and PMH reviewed.  Patient was converted from IV heparin to apixaban 6/19, but then converted back to IV heparin after experiencing chest pain . Received apixaban 10 mg x 1 on 6/19 @1310   Today, 06/16/2019:  APTT therapeutic (95) and anti-Xa heparin level therapeutic (0.64) on heparin 1500 units/hr  H/H and Pltc WNL  No bleeding or infusion interruptions per RN report  Orders now received from attending MD to transition patient from IV heparin to PO apixaban in anticipation of discharge.    Plan:   At 10am today:  Stop heparin infusion  Begin apixaban 10 mg PO BID x 7 days, then 5 mg PO BID   Patient was provided with manufacturer coupon and medication counseling for apixaban on 06/14/19  Clayburn Pert, PharmD, BCPS [phone] 06/16/2019  9:19 AM

## 2019-06-16 NOTE — Discharge Summary (Signed)
Physician Discharge Summary  Lawanda Holzheimer ZOX:[id number] DOB: 06-05-1986 DOA: 06/12/2019  PCP: No primary care provider on file.  Admit date: 06/12/2019 Discharge date: 06/16/2019  Admitted From: Home Disposition: Home   Recommendations for Outpatient Follow-up:  1. Follow up with PCP in 1-2 weeks 2. Please obtain BMP/CBC in one week 3. Please follow up on the following pending results: hypercoagulable panel.  4. Needs referral to hematologist  5. Follow urine culture.   Home Health: none  Discharge Condition: stable.  CODE STATUS: full code Diet recommendation: Heart Healthy   Brief/Interim Summary: 33 year old with no past medical history who presents complaining of shortness of breath, chest pain that is started 4 days prior to admission.  She was evaluated at urgent care for the above symptoms and she was prescribed muscle relaxant which did not help.  She presented to the ED again complaining of chest pain and shortness of breath a CT angios showed bilateral PE. In discussion with her mother, there is family history of blood clot, on uncle and aunt.   1-Bilateral PE -Small-to-moderate side bilateral PE by CT. -Patient receiving Vicodin for pain. -partial work-up for hypercoagulable panel (pending)  She will need protein S, protein C when she follows with her PCP.  -She will require at least 6 months of treatment.  She will benefit from following with a hematologist.  she has a family history of blood clot and she might require long-term anticoagulation. -Echo and lower extremity Dopplers negative. -The afternoon of 6-19 patient develops worsening chest pain, suddenly. Her vital were stable BP in the 130, Oxygen sat 100 %. I  evaluated patient, pain was improving with Vicodin. She was not in distress, lungs clear. Vitals stable. PESI score low, level 1. Discussed case with pulmonologist, Eliquis was   back to heparin. Troponin and lactic acid normal.  She was transition to eliquis.  She is feeling well, denies worsening chest pain. Vitals stable.   2-vaginitis: Continue with Flagyl.  3-Morbid obesity: BMI of 38 I encourage patient to lose weight.  4-UTI; Notice small amount blood in her underwear.  UA with Few WBC and Hb.  Treat with keflex.  Send urine culture.   Discharge Diagnoses:  Principal Problem:   Acute pulmonary embolism [facility]) Active Problems:   Pulmonary emboli [facility])    Discharge Instructions  Discharge Instructions    Diet - low sodium heart healthy   Complete by: As directed    Increase activity slowly   Complete by: As directed      Allergies as of 06/16/2019   No Known Allergies     Medication List    STOP taking these medications   cyclobenzaprine 7.5 MG tablet Commonly known as: FEXMID   ibuprofen 200 MG tablet Commonly known as: ADVIL     TAKE these medications   albuterol 108 (90 Base) MCG/ACT inhaler Commonly known as: VENTOLIN HFA Inhale 2 puffs into the lungs every 4 (four) hours as needed for wheezing or shortness of breath.   cephALEXin 500 MG capsule Commonly known as: KEFLEX Take 1 capsule (500 mg total) by mouth every 12 (twelve) hours.   Eliquis DVT/PE Starter Pack 5 MG Tabs Take as directed on package: start with two-5mg  tablets twice daily for 7 days. On day 8, switch to one-5mg  tablet twice daily. Notes to patient: Next dose due 06/17/2019   HAIR SKIN & NAILS GUMMIES PO Take 1 tablet by mouth daily. Notes to patient: Next dose due 06/17/2019   HYDROcodone-acetaminophen 5-325  MG tablet Commonly known as: NORCO/VICODIN Take 2 tablets by mouth every 6 (six) hours as needed for up to 3 days. What changed: when to take this Notes to patient: Next dose available 3:30 PM   metroNIDAZOLE 500 MG tablet Commonly known as: FLAGYL Take 500 mg by mouth 2 (two) times a day.   multivitamin with minerals Tabs tablet Take 1 tablet by mouth daily.   polyethylene glycol 17 g packet Commonly known as: MIRALAX  / GLYCOLAX Take 17 g by mouth 2 (two) times daily.   senna-docusate 8.6-50 MG tablet Commonly known as: Senokot-S Take 1 tablet by mouth 2 (two) times daily.       No Known Allergies  Consultations:  pulmonology Phone    Procedures/Studies: Dg Chest 2 View  Result Date: 06/12/2019 CLINICAL DATA:  Chest pain and shortness of breath. EXAM: CHEST - 2 VIEW COMPARISON:  02/20/2013 FINDINGS: Low lung volumes. Heart is normal in size. Normal mediastinal contours. Small left pleural effusion and basilar atelectasis/airspace disease. Mild peribronchial thickening. No pneumothorax. IMPRESSION: Small left pleural effusion and left basilar atelectasis/airspace disease. Electronically Signed   By: Narda Rutherford M.D.   On: 06/12/2019 23:46   Ct Angio Chest Pe W And/or Wo Contrast  Result Date: 06/13/2019 CLINICAL DATA:  PE suspected, intermediate prob, positive D-dimer. Left chest pain. EXAM: CT ANGIOGRAPHY CHEST WITH CONTRAST TECHNIQUE: Multidetector CT imaging of the chest was performed using the standard protocol during bolus administration of intravenous contrast. Multiplanar CT image reconstructions and MIPs were obtained to evaluate the vascular anatomy. CONTRAST:  75 cc OMNIPAQUE IOHEXOL 350 MG/ML SOLN COMPARISON:  Radiograph yesterday FINDINGS: Cardiovascular: Examination is positive for pulmonary emboli. Filling defects in the left lower lobar pulmonary artery extend into the segmental and subsegmental branches, greatest thromboembolic burden to the lateral basal left lower lobe. Small subsegmental filling defect also seen in the subsegmental right lower lobe pulmonary artery. Thromboembolic burden is small to moderate. No right heart strain, RV to LV ratio is 0.6. thoracic aorta is normal in caliber without dissection. No pericardial effusion. Mediastinum/Nodes: Enlarged mediastinal or hilar lymph nodes. No visualized thyroid nodule. Esophagus slightly patulous. No esophageal wall thickening.  Lungs/Pleura: Small left pleural effusion. Streaky and subpleural left lower lobe opacities likely combination of pulmonary infarct and atelectasis. Right lung is clear. Trachea and mainstem bronchi are patent. Upper Abdomen: No acute abnormality. Musculoskeletal: There are no acute or suspicious osseous abnormalities. Review of the MIP images confirms the above findings. IMPRESSION: 1. Positive for bilateral pulmonary emboli, thromboembolic burden is small to moderate. No right heart strain. 2. Left lower lobe opacities likely combination of pulmonary infarct and atelectasis. Small left pleural effusion. Critical Value/emergent results were called by telephone at the time of interpretation on 06/13/2019 at 2:18 am to Dr. Rochele Raring , who verbally acknowledged these results. Electronically Signed   By: Narda Rutherford M.D.   On: 06/13/2019 02:18   Vas [place] Lower Extremity Venous (dvt)  Result Date: 06/14/2019  Lower Venous Study Indications: Edema.  Limitations: Body habitus. Comparison Study: no prior Performing Technologist: Jeb Levering RDMS, RVT  Examination Guidelines: A complete evaluation includes B-mode imaging, spectral Doppler, color Doppler, and power Doppler as needed of all accessible portions of each vessel. Bilateral testing is considered an integral part of a complete examination. Limited examinations for reoccurring indications may be performed as noted.  +---------+---------------+---------+-----------+----------+-------+ RIGHT    CompressibilityPhasicitySpontaneityPropertiesSummary +---------+---------------+---------+-----------+----------+-------+ CFV      Full  Yes      Yes                          +---------+---------------+---------+-----------+----------+-------+ SFJ      Full                                                 +---------+---------------+---------+-----------+----------+-------+ FV Prox  Full                                                  +---------+---------------+---------+-----------+----------+-------+ FV Mid   Full                                                 +---------+---------------+---------+-----------+----------+-------+ FV DistalFull                                                 +---------+---------------+---------+-----------+----------+-------+ PFV      Full                                                 +---------+---------------+---------+-----------+----------+-------+ POP      Full           Yes      Yes                          +---------+---------------+---------+-----------+----------+-------+ PTV      Full                                                 +---------+---------------+---------+-----------+----------+-------+ PERO     Full                                                 +---------+---------------+---------+-----------+----------+-------+   Right Technical Findings: No all segments well visualized  +---------+---------------+---------+-----------+----------+-------+ LEFT     CompressibilityPhasicitySpontaneityPropertiesSummary +---------+---------------+---------+-----------+----------+-------+ CFV      Full           Yes      Yes                          +---------+---------------+---------+-----------+----------+-------+ SFJ      Full                                                 +---------+---------------+---------+-----------+----------+-------+ FV Prox  Full                                                 +---------+---------------+---------+-----------+----------+-------+  FV Mid   Full                                                 +---------+---------------+---------+-----------+----------+-------+ FV DistalFull                                                 +---------+---------------+---------+-----------+----------+-------+ PFV      Full                                                  +---------+---------------+---------+-----------+----------+-------+ POP      Full           Yes      Yes                          +---------+---------------+---------+-----------+----------+-------+ PTV      Full                                                 +---------+---------------+---------+-----------+----------+-------+ PERO     Full                                                 +---------+---------------+---------+-----------+----------+-------+   Left Technical Findings: No all segments well visualized   Summary: Right: There is no evidence of deep vein thrombosis in the lower extremity. However, portions of this examination were limited- see technologist comments above. No cystic structure found in the popliteal fossa. Left: There is no evidence of deep vein thrombosis in the lower extremity. However, portions of this examination were limited- see technologist comments above. No cystic structure found in the popliteal fossa.  *See table(s) above for measurements and observations. Electronically signed by Sherald Hess MD on 06/14/2019 at 4:58:00 PM.    Final      Subjective: Had BM no blood on it.  Notice small amount of blood with urine.    Discharge Exam: Vitals:   06/16/19 0550 06/16/19 0933  BP: 105/78 114/77  Pulse: 92 (!) 108  Resp: 16 20  Temp: 98.7 F (37.1 C) 98.6 F (37 C)  SpO2: 98% 97%     General: Pt is alert, awake, not in acute distress Cardiovascular: RRR, S1/S2 +, no rubs, no gallops Respiratory: CTA bilaterally, no wheezing, no rhonchi Abdominal: Soft, NT, ND, bowel sounds + Extremities: no edema, no cyanosis    The results of significant diagnostics from this hospitalization (including imaging, microbiology, ancillary and laboratory) are listed below for reference.     Microbiology: Recent Results (from the past 240 hour(s))  SARS Coronavirus 2 (Hosp order,Performed in [facility] lab via [employer] ID)     Status: None    Collection Time: 06/13/19 12:36 AM   Specimen: Dry Nasal Swab (Abbott ID Now)  Result Value Ref Range Status  SARS Coronavirus 2 (Abbott ID Now) NEGATIVE NEGATIVE Final    Comment: (NOTE) Interpretive Result Comment(s): COVID 19 Positive SARS CoV 2 target nucleic acids are DETECTED. The SARS CoV 2 RNA is generally detectable in upper and lower respiratory specimens during the acute phase of infection.  Positive results are indicative of active infection with SARS CoV 2.  Clinical correlation with patient history and other diagnostic information is necessary to determine patient infection status.  Positive results do not rule out bacterial infection or coinfection with other viruses. The expected result is Negative. COVID 19 Negative SARS CoV 2 target nucleic acids are NOT DETECTED. The SARS CoV 2 RNA is generally detectable in upper and lower respiratory specimens during the acute phase of infection.  Negative results do not preclude SARS CoV 2 infection, do not rule out coinfections with other pathogens, and should not be used as the sole basis for treatment or other patient management decisions.  Negative results must be combined with clinical  observations, patient history, and epidemiological information. The expected result is Negative. Invalid Presence or absence of SARS CoV 2 nucleic acids cannot be determined. Repeat testing was performed on the submitted specimen and repeated Invalid results were obtained.  If clinically indicated, additional testing on a new specimen with an alternate test methodology [phone]([id number]) is advised.  The SARS CoV 2 RNA is generally detectable in upper and lower respiratory specimens during the acute phase of infection. The expected result is Negative. Fact Sheet for Patients:  http://www.graves-ford.org/https://www.fda.gov/media/136524/download Fact Sheet for Healthcare Providers: EnviroConcern.sihttps://www.fda.gov/media/136523/download This test is not yet approved or cleared by the  [place] FDA and has been authorized for detection and/or diagnosis of SARS CoV 2 by FDA under an Emergency Use Authorization (EUA).  This EUA will remain in effect (meaning this test can be used) for the duration of the COVID19 d eclaration under Section 564(b)(1) of the Act, 21 U.S.C. section [phone] 3(b)(1), unless the authorization is terminated or revoked sooner. Performed at [facility] [place], [address] [place], [place] [address]   SARS Coronavirus 2 (CEPHEID - Performed in [facility] hospital lab), Hosp Order     Status: None   Collection Time: 06/13/19  7:28 AM   Specimen: Nasopharyngeal Swab  Result Value Ref Range Status   SARS Coronavirus 2 NEGATIVE NEGATIVE Final    Comment: (NOTE) If result is NEGATIVE SARS-CoV-2 target nucleic acids are NOT DETECTED. The SARS-CoV-2 RNA is generally detectable in upper and lower  respiratory specimens during the acute phase of infection. The lowest  concentration of SARS-CoV-2 viral copies this assay can detect is 250  copies / mL. A negative result does not preclude SARS-CoV-2 infection  and should not be used as the sole basis for treatment or other  patient management decisions.  A negative result may occur with  improper specimen collection / handling, submission of specimen other  than nasopharyngeal swab, presence of viral mutation(s) within the  areas targeted by this assay, and inadequate number of viral copies  (<250 copies / mL). A negative result must be combined with clinical  observations, patient history, and epidemiological information. If result is POSITIVE SARS-CoV-2 target nucleic acids are DETECTED. The SARS-CoV-2 RNA is generally detectable in upper and lower  respiratory specimens dur ing the acute phase of infection.  Positive  results are indicative of active infection with SARS-CoV-2.  Clinical  correlation with patient history and other diagnostic information is  necessary to determine  patient infection status.  Positive results do  not rule out bacterial infection or co-infection with other viruses. If result is PRESUMPTIVE POSTIVE SARS-CoV-2 nucleic acids MAY BE PRESENT.   A presumptive positive result was obtained on the submitted specimen  and confirmed on repeat testing.  While 2019 novel coronavirus  (SARS-CoV-2) nucleic acids may be present in the submitted sample  additional confirmatory testing may be necessary for epidemiological  and / or clinical management purposes  to differentiate between  SARS-CoV-2 and other Sarbecovirus currently known to infect humans.  If clinically indicated additional testing with an alternate test  methodology [phone]) is advised. The SARS-CoV-2 RNA is generally  detectable in upper and lower respiratory sp ecimens during the acute  phase of infection. The expected result is Negative. Fact Sheet for Patients:  BoilerBrush.com.cy Fact Sheet for Healthcare Providers: https://pope.com/ This test is not yet approved or cleared by the [place] FDA and has been authorized for detection and/or diagnosis of SARS-CoV-2 by FDA under an Emergency Use Authorization (EUA).  This EUA will remain in effect (meaning this test can be used) for the duration of the COVID-19 declaration under Section 564(b)(1) of the Act, 21 U.S.C. section 360bbb-3(b)(1), unless the authorization is terminated or revoked sooner. Performed at [facility], [address], [place], [place] [address]      Labs: BNP (last 3 results) Recent Labs    06/14/19 1801  BNP 32.3   Basic Metabolic Panel: Recent Labs  Lab 06/12/19 2356 06/15/19 0709  NA 136 135  K 3.8 4.0  CL 106 104  CO2 22 22  GLUCOSE 117* 89  BUN 14 9  CREATININE 0.73 0.67  CALCIUM 8.9 8.7*   Liver Function Tests: Recent Labs  Lab 06/12/19 2356  AST 18  ALT 14  ALKPHOS 77  BILITOT 0.2*  PROT 7.9  ALBUMIN 3.7    Recent Labs  Lab 06/12/19 2356  LIPASE 45   No results for input(s): AMMONIA in the last 168 hours. CBC: Recent Labs  Lab 06/12/19 2356 06/13/19 2241 06/14/19 0718 06/15/19 0709 06/16/19 0416  WBC 9.2 8.7 8.4 8.7 10.2  NEUTROABS 6.2  --   --   --   --   HGB 12.5 12.2 12.1 12.5 13.3  HCT 37.6 37.5 37.7 37.9 40.0  MCV 87.9 90.1 88.9 88.1 87.7  PLT 304 274 300 304 334   Cardiac Enzymes: Recent Labs  Lab 06/12/19 2330 06/14/19 1726 06/14/19 2247 06/15/19 0709  TROPONINI <0.03 <0.03 <0.03 <0.03   BNP: Invalid input(s): POCBNP CBG: No results for input(s): GLUCAP in the last 168 hours. D-Dimer No results for input(s): DDIMER in the last 72 hours. Hgb A1c No results for input(s): HGBA1C in the last 72 hours. Lipid Profile No results for input(s): CHOL, HDL, LDLCALC, TRIG, CHOLHDL, LDLDIRECT in the last 72 hours. Thyroid function studies No results for input(s): TSH, T4TOTAL, T3FREE, THYROIDAB in the last 72 hours.  Invalid input(s): FREET3 Anemia work up No results for input(s): VITAMINB12, FOLATE, FERRITIN, TIBC, IRON, RETICCTPCT in the last 72 hours. Urinalysis    Component Value Date/Time   COLORURINE YELLOW 06/16/2019 1347   APPEARANCEUR HAZY (A) 06/16/2019 1347   LABSPEC 1.009 06/16/2019 1347   PHURINE 6.0 06/16/2019 1347   GLUCOSEU NEGATIVE 06/16/2019 1347   HGBUR LARGE (A) 06/16/2019 1347   BILIRUBINUR NEGATIVE 06/16/2019 1347   BILIRUBINUR neg 01/09/2013 1840   KETONESUR 5 (A) 06/16/2019 1347   PROTEINUR NEGATIVE 06/16/2019 1347   UROBILINOGEN 0.2 01/09/2013 1840  NITRITE NEGATIVE 06/16/2019 1347   LEUKOCYTESUR SMALL (A) 06/16/2019 1347   Sepsis Labs Invalid input(s): PROCALCITONIN,  WBC,  LACTICIDVEN Microbiology Recent Results (from the past 240 hour(s))  SARS Coronavirus 2 (Hosp order,Performed in [facility] lab via [facility] ID)     Status: None   Collection Time: 06/13/19 12:36 AM   Specimen: Dry Nasal Swab (Abbott ID Now)  Result Value  Ref Range Status   SARS Coronavirus 2 (Abbott ID Now) NEGATIVE NEGATIVE Final    Comment: (NOTE) Interpretive Result Comment(s): COVID 19 Positive SARS CoV 2 target nucleic acids are DETECTED. The SARS CoV 2 RNA is generally detectable in upper and lower respiratory specimens during the acute phase of infection.  Positive results are indicative of active infection with SARS CoV 2.  Clinical correlation with patient history and other diagnostic information is necessary to determine patient infection status.  Positive results do not rule out bacterial infection or coinfection with other viruses. The expected result is Negative. COVID 19 Negative SARS CoV 2 target nucleic acids are NOT DETECTED. The SARS CoV 2 RNA is generally detectable in upper and lower respiratory specimens during the acute phase of infection.  Negative results do not preclude SARS CoV 2 infection, do not rule out coinfections with other pathogens, and should not be used as the sole basis for treatment or other patient management decisions.  Negative results must be combined with clinical  observations, patient history, and epidemiological information. The expected result is Negative. Invalid Presence or absence of SARS CoV 2 nucleic acids cannot be determined. Repeat testing was performed on the submitted specimen and repeated Invalid results were obtained.  If clinically indicated, additional testing on a new specimen with an alternate test methodology [phone]) is advised.  The SARS CoV 2 RNA is generally detectable in upper and lower respiratory specimens during the acute phase of infection. The expected result is Negative. Fact Sheet for Patients:  http://www.graves-ford.org/https://www.fda.gov/media/136524/download Fact Sheet for Healthcare Providers: EnviroConcern.sihttps://www.fda.gov/media/136523/download This test is not yet approved or cleared by the [place] FDA and has been authorized for detection and/or diagnosis of SARS CoV 2  by FDA under an Emergency Use Authorization (EUA).  This EUA will remain in effect (meaning this test can be used) for the duration of the COVID19 d eclaration under Section 564(b)(1) of the Act, 21 U.S.C. section [phone] 3(b)(1), unless the authorization is terminated or revoked sooner. Performed at [facility] [place], [address] [place], [place] [address]   SARS Coronavirus 2 (CEPHEID - Performed in [facility] lab), Hosp Order     Status: None   Collection Time: 06/13/19  7:28 AM   Specimen: Nasopharyngeal Swab  Result Value Ref Range Status   SARS Coronavirus 2 NEGATIVE NEGATIVE Final    Comment: (NOTE) If result is NEGATIVE SARS-CoV-2 target nucleic acids are NOT DETECTED. The SARS-CoV-2 RNA is generally detectable in upper and lower  respiratory specimens during the acute phase of infection. The lowest  concentration of SARS-CoV-2 viral copies this assay can detect is 250  copies / mL. A negative result does not preclude SARS-CoV-2 infection  and should not be used as the sole basis for treatment or other  patient management decisions.  A negative result may occur with  improper specimen collection / handling, submission of specimen other  than nasopharyngeal swab, presence of viral mutation(s) within the  areas targeted by this assay, and inadequate number of viral copies  (<250 copies / mL). A negative  result must be combined with clinical  observations, patient history, and epidemiological information. If result is POSITIVE SARS-CoV-2 target nucleic acids are DETECTED. The SARS-CoV-2 RNA is generally detectable in upper and lower  respiratory specimens dur ing the acute phase of infection.  Positive  results are indicative of active infection with SARS-CoV-2.  Clinical  correlation with patient history and other diagnostic information is  necessary to determine patient infection status.  Positive results do  not rule out bacterial infection or  co-infection with other viruses. If result is PRESUMPTIVE POSTIVE SARS-CoV-2 nucleic acids MAY BE PRESENT.   A presumptive positive result was obtained on the submitted specimen  and confirmed on repeat testing.  While 2019 novel coronavirus  (SARS-CoV-2) nucleic acids may be present in the submitted sample  additional confirmatory testing may be necessary for epidemiological  and / or clinical management purposes  to differentiate between  SARS-CoV-2 and other Sarbecovirus currently known to infect humans.  If clinically indicated additional testing with an alternate test  methodology [phone] is advised. The SARS-CoV-2 RNA is generally  detectable in upper and lower respiratory sp ecimens during the acute  phase of infection. The expected result is Negative. Fact Sheet for Patients:  StrictlyIdeas.no Fact Sheet for Healthcare Providers: BankingDealers.co.za This test is not yet approved or cleared by the [place] FDA and has been authorized for detection and/or diagnosis of SARS-CoV-2 by FDA under an Emergency Use Authorization (EUA).  This EUA will remain in effect (meaning this test can be used) for the duration of the COVID-19 declaration under Section 564(b)(1) of the Act, 21 U.S.C. section 360bbb-3(b)(1), unless the authorization is terminated or revoked sooner. Performed at [facility], [place] [address], [place],  [address]      Time coordinating discharge: 40 minutes  SIGNED:   Elmarie Shiley, MD  [facility]

## 2019-06-17 LAB — URINE CULTURE

## 2019-06-17 LAB — HOMOCYSTEINE: Homocysteine: 5.7 umol/L (ref 0.0–14.5)

## 2019-06-20 LAB — PROTHROMBIN GENE MUTATION

## 2019-06-20 LAB — DRVVT MIX: dRVVT Mix: 41.3 s (ref 0.0–47.0)

## 2019-06-20 LAB — LUPUS ANTICOAGULANT PANEL
DRVVT: 52.3 s — ABNORMAL HIGH (ref 0.0–47.0)
PTT Lupus Anticoagulant: 54.1 s — ABNORMAL HIGH (ref 0.0–51.9)

## 2019-06-20 LAB — HEXAGONAL PHASE PHOSPHOLIPID: Hexagonal Phase Phospholipid: 0 s (ref 0–11)

## 2019-06-20 LAB — FACTOR 5 LEIDEN

## 2019-06-20 LAB — PTT-LA MIX: PTT-LA Mix: 49.2 s — ABNORMAL HIGH (ref 0.0–48.9)

## 2020-03-14 IMAGING — DX CHEST - 2 VIEW
2 series · 2 of 2 positions shown · non-contrast
Comparison: 02/20/2013

CLINICAL DATA: Chest pain and shortness of breath.

EXAM:
CHEST - 2 VIEW

[chest pa]
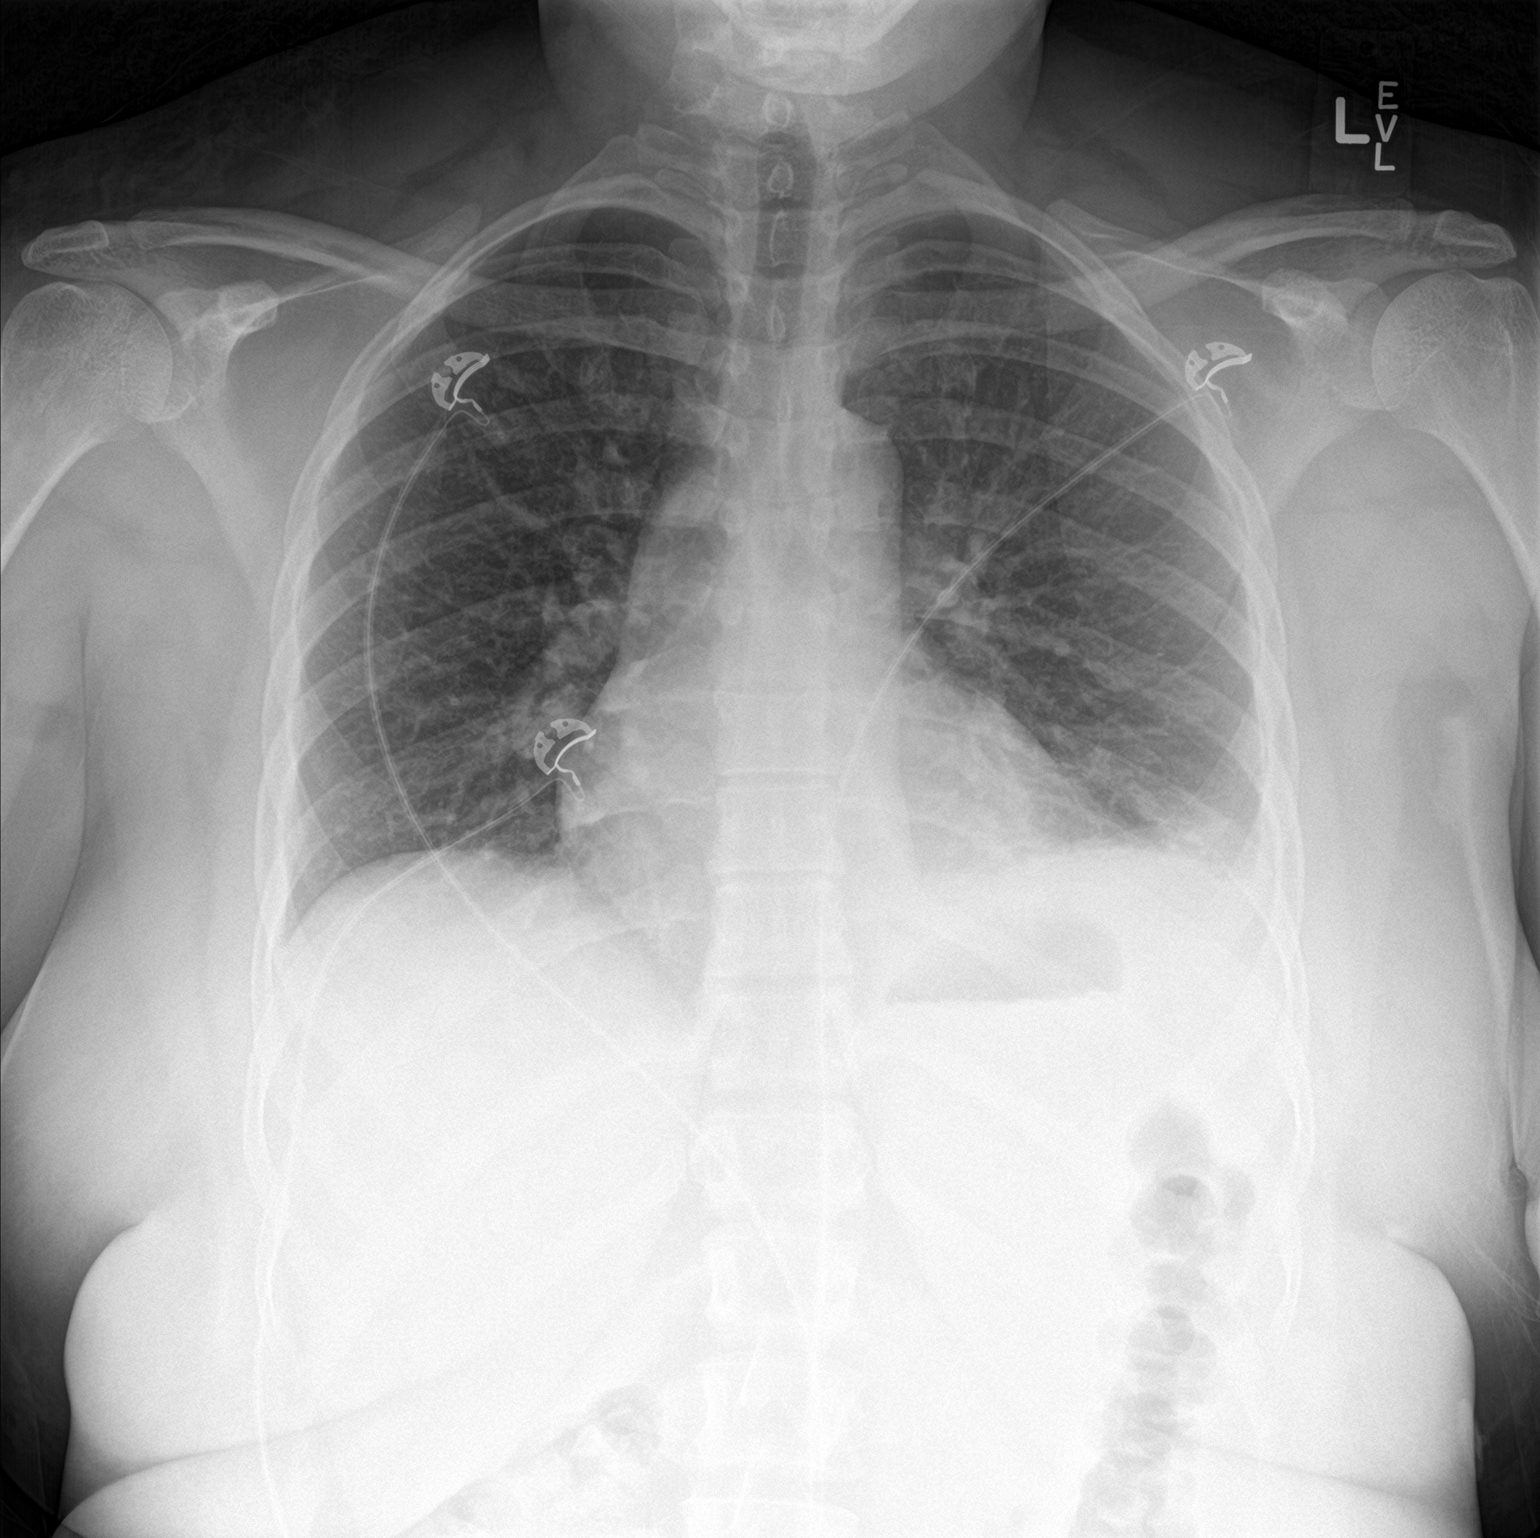

[chest lat]
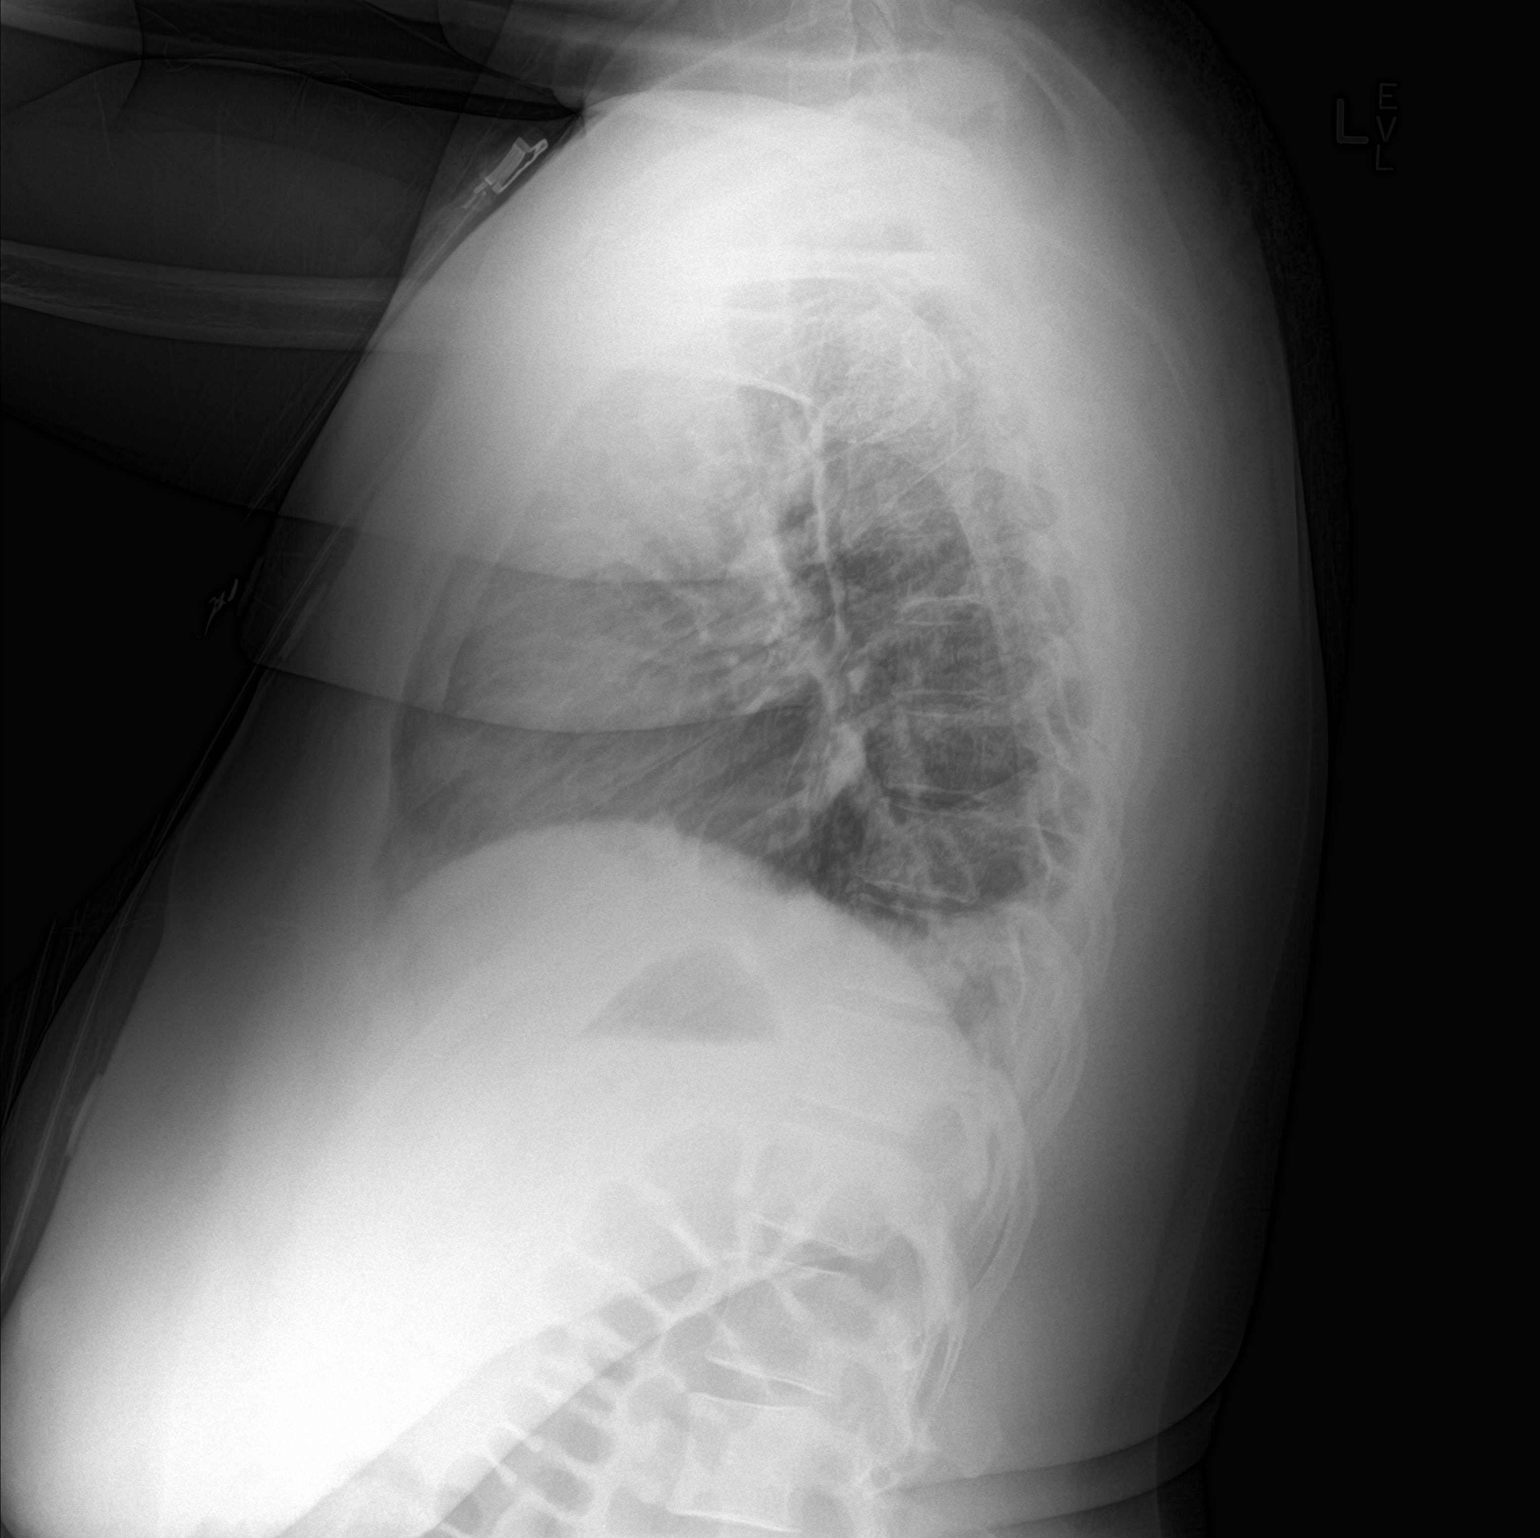

[2 of 2 positions shown; findings below may reference images not displayed]

FINDINGS: Low lung volumes. Heart is normal in size. Normal mediastinal
contours. Small left pleural effusion and basilar
atelectasis/airspace disease. Mild peribronchial thickening. No
pneumothorax.
IMPRESSION: Small left pleural effusion and left basilar atelectasis/airspace
disease.

## 2024-10-17 NOTE — Progress Notes (Signed)
 The systolic reading is great the diastolic reading is still high and I do not normally push the hydrochlorothiazide up to 25 but I think in this case I would and just make sure he is supporting his potassium with over-the-counter potassium and do a repeat BP check 2 weeks with a BMP.  Thanks Suzann

## 2024-10-17 NOTE — Progress Notes (Signed)
 Pt came in today for a BP check. Pt brought in home BP readings: no  Pt is taking these BP meds: Name:HTCZ, Strength:12.5 mg , Frequency:daily Name:Losartan, Strength:50 mg, Frequency:2X daily Date and Time of Last Dose: 10/17/2024, 7 am  BP was taken on the Left arm, using the large, automatic cuff. Arm was raised to heart level. BP reading was 126/93 Pulse 86   If BP greater than 130/80, repeat taken after 5 min and documented in Vitals: yes  BP was taken on the Left arm, using the large, automatic cuff. Arm was raised to heart level. BP reading was .121/88 Pulse 84   Electronically signed by: Zebedee DELENA Carder, CMA 10/17/2024 9:14 AM

## 2024-10-27 ENCOUNTER — Other Ambulatory Visit: Payer: Self-pay

## 2024-10-27 ENCOUNTER — Emergency Department (HOSPITAL_BASED_OUTPATIENT_CLINIC_OR_DEPARTMENT_OTHER)

## 2024-10-27 ENCOUNTER — Encounter (HOSPITAL_BASED_OUTPATIENT_CLINIC_OR_DEPARTMENT_OTHER): Payer: Self-pay

## 2024-10-27 ENCOUNTER — Emergency Department (HOSPITAL_BASED_OUTPATIENT_CLINIC_OR_DEPARTMENT_OTHER)
Admission: EM | Admit: 2024-10-27 | Discharge: 2024-10-27 | Disposition: A | Discharge status: Home / Self Care | Attending: Emergency Medicine | Admitting: Emergency Medicine

## 2024-10-27 DIAGNOSIS — R110 Nausea: Secondary | ICD-10-CM | POA: Diagnosis not present

## 2024-10-27 DIAGNOSIS — R1032 Left lower quadrant pain: Secondary | ICD-10-CM | POA: Insufficient documentation

## 2024-10-27 DIAGNOSIS — D72829 Elevated white blood cell count, unspecified: Secondary | ICD-10-CM | POA: Insufficient documentation

## 2024-10-27 DIAGNOSIS — Z7901 Long term (current) use of anticoagulants: Secondary | ICD-10-CM | POA: Insufficient documentation

## 2024-10-27 LAB — COMPREHENSIVE METABOLIC PANEL WITH GFR
ALT: 11 U/L (ref 0–44)
AST: 23 U/L (ref 15–41)
Albumin: 4.1 g/dL (ref 3.5–5.0)
Alkaline Phosphatase: 89 U/L (ref 38–126)
Anion gap: 11 (ref 5–15)
BUN: 6 mg/dL (ref 6–20)
CO2: 22 mmol/L (ref 22–32)
Calcium: 9.2 mg/dL (ref 8.9–10.3)
Chloride: 103 mmol/L (ref 98–111)
Creatinine, Ser: 0.85 mg/dL (ref 0.44–1.00)
GFR, Estimated: >60 mL/min (ref >60)
Glucose, Bld: 94 mg/dL (ref 70–99)
Potassium: 4 mmol/L (ref 3.5–5.1)
Sodium: 136 mmol/L (ref 135–145)
Total Bilirubin: 0.7 mg/dL (ref 0.0–1.2)
Total Protein: 8.2 g/dL — ABNORMAL HIGH (ref 6.5–8.1)

## 2024-10-27 LAB — CBC
HCT: 30.8 % — ABNORMAL LOW (ref 36.0–46.0)
Hemoglobin: 10 g/dL — ABNORMAL LOW (ref 12.0–15.0)
MCH: 26.7 pg (ref 26.0–34.0)
MCHC: 32.5 g/dL (ref 30.0–36.0)
MCV: 82.1 fL (ref 80.0–100.0)
Platelets: 393 K/uL (ref 150–400)
RBC: 3.75 MIL/uL — ABNORMAL LOW (ref 3.87–5.11)
RDW: 17.9 % — ABNORMAL HIGH (ref 11.5–15.5)
WBC: 15.3 K/uL — ABNORMAL HIGH (ref 4.0–10.5)
nRBC: 0 % (ref 0.0–0.2)

## 2024-10-27 LAB — URINALYSIS, ROUTINE W REFLEX MICROSCOPIC
Bilirubin Urine: NEGATIVE
Glucose, UA: NEGATIVE mg/dL
Ketones, ur: NEGATIVE mg/dL
Nitrite: NEGATIVE
Protein, ur: NEGATIVE mg/dL
Specific Gravity, Urine: 1.015 (ref 1.005–1.030)
pH: 7 (ref 5.0–8.0)

## 2024-10-27 LAB — URINALYSIS, MICROSCOPIC (REFLEX)

## 2024-10-27 LAB — LIPASE, BLOOD: Lipase: 67 U/L — ABNORMAL HIGH (ref 11–51)

## 2024-10-27 LAB — PREGNANCY, URINE: Preg Test, Ur: NEGATIVE

## 2024-10-27 MED ORDER — MORPHINE SULFATE (PF) 4 MG/ML IV SOLN
4.0000 mg | Freq: Once | INTRAVENOUS | Status: AC
  Administered 2024-10-27: 4 mg via INTRAVENOUS
  Filled 2024-10-27: qty 1

## 2024-10-27 MED ORDER — IOHEXOL 300 MG/ML  SOLN
100.0000 mL | Freq: Once | INTRAMUSCULAR | Status: AC | PRN
  Administered 2024-10-27: 100 mL via INTRAVENOUS

## 2024-10-27 MED ORDER — ONDANSETRON HCL 4 MG/2ML IJ SOLN
4.0000 mg | Freq: Once | INTRAMUSCULAR | Status: AC
  Administered 2024-10-27: 4 mg via INTRAVENOUS
  Filled 2024-10-27: qty 2

## 2024-10-27 MED ORDER — CEPHALEXIN 500 MG PO CAPS: 500.0000 mg | ORAL_CAPSULE | Freq: Four times a day (QID) | ORAL | 0 refills | Status: AC

## 2024-10-27 NOTE — ED Notes (Signed)
 Patient transported to Ultrasound

## 2024-10-27 NOTE — ED Notes (Signed)

## 2024-10-27 NOTE — ED Triage Notes (Signed)
 L sided abd pain, nausea, dysuria since yesterday. Denies emesis, diarrhea, fevers

## 2024-10-27 NOTE — ED Notes (Signed)
 Called lab for add on urine culture. Pt was PO challenged a while ago without incident. Pt eating a meal at present.

## 2024-10-27 NOTE — ED Provider Notes (Signed)
 [place] [facility] Provider Note   CSN: [id number] Arrival date & time: 10/27/24  1303     Patient presents with: Abdominal Pain   Alice Henry is a 38 y.o. female otherwise healthy presents with complaints of left lower quadrant abdominal pain since yesterday.  Pain is constant without any aggravating relieving factors.  Associate with nausea without vomiting or diarrhea.  No urinary or vaginal symptoms.  No prior abdominal surgeries.    Abdominal Pain     Past Medical History:  Diagnosis Date   Allergy    History reviewed. No pertinent surgical history.   Prior to Admission medications   Medication Sig Start Date End Date Taking? Authorizing Provider  albuterol (VENTOLIN HFA) 108 (90 Base) MCG/ACT inhaler Inhale 2 puffs into the lungs every 4 (four) hours as needed for wheezing or shortness of breath.    [provider]  Biotin w/ Vitamins C & E (HAIR SKIN & NAILS GUMMIES PO) Take 1 tablet by mouth daily.    [provider]  cephALEXin  (KEFLEX ) 500 MG capsule Take 1 capsule (500 mg total) by mouth 4 (four) times daily. 10/27/24   Donnajean Lynwood DEL, PA-C  Eliquis  DVT/PE Starter Pack (ELIQUIS  STARTER PACK) 5 MG TABS Take as directed on package: start with two-5mg  tablets twice daily for 7 days. On day 8, switch to one-5mg  tablet twice daily. 06/16/19   Regalado, Belkys A, MD  Multiple Vitamin (MULTIVITAMIN WITH MINERALS) TABS tablet Take 1 tablet by mouth daily.    [provider]  polyethylene glycol (MIRALAX  / GLYCOLAX ) 17 g packet Take 17 g by mouth 2 (two) times daily. 06/16/19   Regalado, Belkys A, MD  senna-docusate (SENOKOT-S) 8.6-50 MG tablet Take 1 tablet by mouth 2 (two) times daily. 06/16/19   Regalado, Owen LABOR, MD    Allergies: Patient has no known allergies.    Review of Systems  Gastrointestinal:  Positive for abdominal pain.    Updated Vital Signs BP 114/64   Pulse 94   Temp 99 F (37.2 C)  (Oral)   Resp 16   LMP 09/18/2024   SpO2 100%   Physical Exam Vitals and nursing note reviewed.  Constitutional:      General: She is not in acute distress.    Appearance: She is well-developed.  HENT:     Head: Normocephalic and atraumatic.  Eyes:     Conjunctiva/sclera: Conjunctivae normal.  Cardiovascular:     Rate and Rhythm: Normal rate and regular rhythm.     Heart sounds: No murmur heard. Pulmonary:     Effort: Pulmonary effort is normal. No respiratory distress.     Breath sounds: Normal breath sounds.  Abdominal:     Palpations: Abdomen is soft.     Tenderness: There is abdominal tenderness.     Comments: Tenderness to left lower quadrant, abdomen is soft and nondistended  Musculoskeletal:        General: No swelling.     Cervical back: Neck supple.  Skin:    General: Skin is warm and dry.     Capillary Refill: Capillary refill takes less than 2 seconds.  Neurological:     Mental Status: She is alert.  Psychiatric:        Mood and Affect: Mood normal.     (all labs ordered are listed, but only abnormal results are displayed) Labs Reviewed  LIPASE, BLOOD - Abnormal; Notable for the following components:      Result  Value   Lipase 67 (*)    All other components within normal limits  COMPREHENSIVE METABOLIC PANEL WITH GFR - Abnormal; Notable for the following components:   Total Protein 8.2 (*)    All other components within normal limits  CBC - Abnormal; Notable for the following components:   WBC 15.3 (*)    RBC 3.75 (*)    Hemoglobin 10.0 (*)    HCT 30.8 (*)    RDW 17.9 (*)    All other components within normal limits  URINALYSIS, ROUTINE W REFLEX MICROSCOPIC - Abnormal; Notable for the following components:   Hgb urine dipstick LARGE (*)    Leukocytes,Ua TRACE (*)    All other components within normal limits  URINALYSIS, MICROSCOPIC (REFLEX) - Abnormal; Notable for the following components:   Bacteria, UA RARE (*)    All other components within  normal limits  PREGNANCY, URINE    EKG: None  Radiology: US  PELVIC COMPLETE W TRANSVAGINAL AND TORSION R/O Result Date: 10/27/2024 CLINICAL DATA:  Left lower quadrant pain. EXAM: TRANSABDOMINAL AND TRANSVAGINAL ULTRASOUND OF PELVIS DOPPLER ULTRASOUND OF OVARIES TECHNIQUE: Both transabdominal and transvaginal ultrasound examinations of the pelvis were performed. Transabdominal technique was performed for global imaging of the pelvis including uterus, ovaries, adnexal regions, and pelvic cul-de-sac. It was necessary to proceed with endovaginal exam following the transabdominal exam to visualize the anatomy. Color and duplex Doppler ultrasound was utilized to evaluate blood flow to the ovaries. COMPARISON:  Pain FINDINGS: Uterus Measurements: 11.3 x 7.5 x 6.9 cm = volume: 301 mL. Fibroids are noted measuring 5.6 x 5.5 x 4.4 cm and 2.5 x 3.1 x 1.9 cm. Endometrium Thickness: 10.8 mm.  No focal abnormality visualized. Right ovary Measurements: 5.8 x 4.1 x 5.9 cm = volume: 84 mL. A cyst with debris is present measuring 3.7 x 3.8 x 4.0 cm. Left ovary Measurements: 2.3 x 1.9 x 1.9 cm = volume: 4.3 mL. Normal appearance/no adnexal mass. Pulsed Doppler evaluation of both ovaries demonstrates normal low-resistance arterial and venous waveforms. Other findings Trace amount of free fluid in the pelvis. Examination is limited due to patient's body habitus. IMPRESSION: 1. No evidence of ovarian torsion. 2. Right ovarian cyst with debris, possible hemorrhagic cyst. 3. Uterine fibroids. Electronically Signed   By: Leita Birmingham M.D.   On: 10/27/2024 18:24   CT ABDOMEN PELVIS W CONTRAST Result Date: 10/27/2024 CLINICAL DATA:  Left-sided abdominal pain with nausea and dysuria EXAM: CT ABDOMEN AND PELVIS WITH CONTRAST TECHNIQUE: Multidetector CT imaging of the abdomen and pelvis was performed using the standard protocol following bolus administration of intravenous contrast. RADIATION DOSE REDUCTION: This exam was performed  according to the departmental dose-optimization program which includes automated exposure control, adjustment of the mA and/or kV according to patient size and/or use of iterative reconstruction technique. CONTRAST:  OMNIPAQUE  IOHEXOL  300 MG/ML  SOLN COMPARISON:  Ultrasound pelvis dated 03/15/2017 FINDINGS: Lower chest: No focal consolidation or pulmonary nodule in the lung bases. No pleural effusion or pneumothorax demonstrated. Partially imaged heart size is normal. Hepatobiliary: No focal hepatic lesions. No intra or extrahepatic biliary ductal dilation. Normal gallbladder. Pancreas: No focal lesions or main ductal dilation. Spleen: Normal in size without focal abnormality. Adrenals/Urinary Tract: No adrenal nodules. No suspicious renal mass, calculi or hydronephrosis. Bilateral fetal lobation. No focal bladder wall thickening. Stomach/Bowel: Small hiatal hernia. Normal appearance of the stomach. No evidence of bowel wall thickening, distention, or inflammatory changes. Normal appendix. Vascular/Lymphatic: No significant vascular findings are present.  No enlarged abdominal or pelvic lymph nodes. Reproductive: Asymmetrically enlarged right ovary measures 5.0 x 4.5 cm (2:60) and contains a heterogeneous hypodensity, incompletely characterized by CT. Uterus contains a heterogeneously hypodense mass measuring 6.3 x 6.0 x 6.1 cm (2:68, 9:117) within the posterior aspect exerting mass effect on the endometrium. Other: Trace pelvic free fluid.  No free air or fluid collection. Musculoskeletal: No acute or abnormal lytic or blastic osseous lesions. Small fat-containing bilateral inguinal hernias. IMPRESSION: 1. Asymmetrically enlarged right ovary contains a heterogeneous hypodensity, incompletely characterized by CT. Recommend pelvic ultrasound for further evaluation. 2. Heterogeneously hypodense mass within the posterior aspect of the uterus exerting mass effect on the endometrium may represent a leiomyoma,  possibly undergoing degeneration. Prior ultrasound examination demonstrated 2 small leiomyomata. This finding can also be evaluated on pelvic ultrasound. 3. Small hiatal hernia. 4. Small fat-containing bilateral inguinal hernias. Electronically Signed   By: Limin  Xu M.D.   On: 10/27/2024 16:34     Procedures   Medications Ordered in the ED  morphine  (PF) 4 MG/ML injection 4 mg (4 mg Intravenous Given 10/27/24 1546)  ondansetron  (ZOFRAN ) injection 4 mg (4 mg Intravenous Given 10/27/24 1547)  iohexol  (OMNIPAQUE ) 300 MG/ML solution 100 mL (100 mLs Intravenous Contrast Given 10/27/24 1554)    Clinical Course as of 10/27/24 1840  Sun Oct 27, 2024  1513 Otherwise healthy patient evaluated for complaints of left lower quadrant abdominal pain that started yesterday.  Upon arrival she is hemodynamically stable.  On exam she has tenderness to the left lower quadrant.  Her workup initiated in triage is notable for leukocytosis of 15.3.  She has trace leukocytes and rare bacteria however no urinary symptoms.  Given her notable leukocytosis and significant tenderness we will obtain CT imaging.  GI cocktail provided. [JT]  1833 US  PELVIC COMPLETE W TRANSVAGINAL AND TORSION R/O Notable for ovarian cyst on the right however pain is predominantly on the left [JT]  1833 Patient reevaluated.  She does report that when she had a UTI in the past her symptoms felt similar.  UA today is equivocal for UTI.  Given her leukocytosis today with symptoms being described as similar I have offered to treat empirically for UTI and send in culture versus discharge home with observation.  Patient would prefer the former.  Will send a course of Keflex .  Encouraged follow-up PCP. [JT]    Clinical Course User Index [JT] Donnajean Lynwood DEL, PA-C                                 Medical Decision Making Amount and/or Complexity of Data Reviewed Labs: ordered.   This patient presents to the ED with chief complaint(s) of abdominal  pain.  The complaint involves an extensive differential diagnosis and also carries with it a high risk of complications and morbidity.   Pertinent past medical history as listed in HPI  The differential diagnosis includes  CT is without any evidence of diverticulitis, pyelonephritis or appendicitis.  No evidence of tubo-ovarian abscess or ovarian torsion.  Do not suspect PID as patient is without any vaginal discharge. Additional history obtained: Records reviewed Care Everywhere/External Records  Disposition:   Patient will be discharged home. The patient has been appropriately medically screened and/or stabilized in the ED. I have low suspicion for any other emergent medical condition which would require further screening, evaluation or treatment in the ED or require inpatient management. At time  of discharge the patient is hemodynamically stable and in no acute distress. I have discussed work-up results and diagnosis with patient and answered all questions. Patient is agreeable with discharge plan. We discussed strict return precautions for returning to the emergency department and they verbalized understanding.     Social Determinants of Health:   none  This note was dictated with voice recognition software.  Despite best efforts at proofreading, errors may have occurred which can change the documentation meaning.       Final diagnoses:  Left lower quadrant abdominal pain    ED Discharge Orders          Ordered    cephALEXin  (KEFLEX ) 500 MG capsule  4 times daily        10/27/24 1839               Donnajean Lynwood VEAR DEVONNA 10/27/24 1841    Ruthe Cornet, DO 10/27/24 2039

## 2024-10-27 NOTE — Discharge Instructions (Signed)
 You were evaluated in the emergency room for abdominal pain.  Your lab work did not show any significant abnormality.  Your imaging did show an ovarian cyst on the right.  Your urine was equivocal for UTI.  You are being treated empirically.  Prescription for antibiotics was sent to your pharmacy.  Please complete the full course of antibiotics unless you were notified that the urine culture is negative.  Otherwise please follow with your primary care doctor and return to emergency room if you experience any worsening symptoms.

## 2024-10-28 LAB — URINE CULTURE
